# Patient Record
Sex: Female | Born: 1995 | Race: White | Hispanic: No | Marital: Single | Smoking: Current every day smoker
Health system: Southern US, Community
[De-identification: ages and names within clinical notes are randomized; demographics above are authoritative.]

## PROBLEM LIST (undated history)

## (undated) ENCOUNTER — Emergency Department (HOSPITAL_COMMUNITY): Admission: EM | Payer: Medicaid Other | Source: Home / Self Care

## (undated) DIAGNOSIS — N83209 Unspecified ovarian cyst, unspecified side: Secondary | ICD-10-CM

---
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
## 2016-02-27 ENCOUNTER — Emergency Department (HOSPITAL_COMMUNITY)
Admission: EM | Admit: 2016-02-27 | Discharge: 2016-02-28 | Disposition: A | Discharge status: Home / Self Care | Payer: Medicaid Other | Attending: Emergency Medicine | Admitting: Emergency Medicine

## 2016-02-27 ENCOUNTER — Encounter (HOSPITAL_COMMUNITY): Payer: Self-pay | Admitting: Radiology

## 2016-02-27 ENCOUNTER — Emergency Department (HOSPITAL_COMMUNITY): Payer: Medicaid Other

## 2016-02-27 DIAGNOSIS — Y92410 Unspecified street and highway as the place of occurrence of the external cause: Secondary | ICD-10-CM | POA: Diagnosis not present

## 2016-02-27 DIAGNOSIS — Z3202 Encounter for pregnancy test, result negative: Secondary | ICD-10-CM | POA: Insufficient documentation

## 2016-02-27 DIAGNOSIS — Y9389 Activity, other specified: Secondary | ICD-10-CM | POA: Diagnosis not present

## 2016-02-27 DIAGNOSIS — S7001XA Contusion of right hip, initial encounter: Secondary | ICD-10-CM | POA: Insufficient documentation

## 2016-02-27 DIAGNOSIS — S59901A Unspecified injury of right elbow, initial encounter: Secondary | ICD-10-CM | POA: Diagnosis present

## 2016-02-27 DIAGNOSIS — Y998 Other external cause status: Secondary | ICD-10-CM | POA: Insufficient documentation

## 2016-02-27 DIAGNOSIS — S5001XA Contusion of right elbow, initial encounter: Secondary | ICD-10-CM | POA: Insufficient documentation

## 2016-02-27 DIAGNOSIS — S50311A Abrasion of right elbow, initial encounter: Secondary | ICD-10-CM | POA: Insufficient documentation

## 2016-02-27 DIAGNOSIS — S8992XA Unspecified injury of left lower leg, initial encounter: Secondary | ICD-10-CM | POA: Insufficient documentation

## 2016-02-27 DIAGNOSIS — S199XXA Unspecified injury of neck, initial encounter: Secondary | ICD-10-CM | POA: Insufficient documentation

## 2016-02-27 DIAGNOSIS — S3991XA Unspecified injury of abdomen, initial encounter: Secondary | ICD-10-CM | POA: Insufficient documentation

## 2016-02-27 DIAGNOSIS — M25551 Pain in right hip: Secondary | ICD-10-CM

## 2016-02-27 DIAGNOSIS — R52 Pain, unspecified: Secondary | ICD-10-CM

## 2016-02-27 DIAGNOSIS — S70211A Abrasion, right hip, initial encounter: Secondary | ICD-10-CM | POA: Insufficient documentation

## 2016-02-27 DIAGNOSIS — S29001A Unspecified injury of muscle and tendon of front wall of thorax, initial encounter: Secondary | ICD-10-CM | POA: Diagnosis not present

## 2016-02-27 LAB — CBC WITH DIFFERENTIAL/PLATELET
BASOS ABS: 0 10*3/uL (ref 0.0–0.1)
BASOS PCT: 0 %
Eosinophils Absolute: 0.1 10*3/uL (ref 0.0–0.7)
Eosinophils Relative: 1 %
HEMATOCRIT: 45.5 % (ref 36.0–46.0)
HEMOGLOBIN: 14.9 g/dL (ref 12.0–15.0)
Lymphocytes Relative: 31 %
Lymphs Abs: 2.9 10*3/uL (ref 0.7–4.0)
MCH: 28.8 pg (ref 26.0–34.0)
MCHC: 32.7 g/dL (ref 30.0–36.0)
MCV: 87.8 fL (ref 78.0–100.0)
MONOS PCT: 6 %
Monocytes Absolute: 0.6 10*3/uL (ref 0.1–1.0)
NEUTROS ABS: 5.6 10*3/uL (ref 1.7–7.7)
NEUTROS PCT: 62 %
Platelets: 291 10*3/uL (ref 150–400)
RBC: 5.18 MIL/uL — AB (ref 3.87–5.11)
RDW: 13.8 % (ref 11.5–15.5)
WBC: 9.1 10*3/uL (ref 4.0–10.5)

## 2016-02-27 LAB — COMPREHENSIVE METABOLIC PANEL
ALBUMIN: 4.3 g/dL (ref 3.5–5.0)
ALK PHOS: 68 U/L (ref 38–126)
ALT: 14 U/L (ref 14–54)
AST: 20 U/L (ref 15–41)
Anion gap: 15 (ref 5–15)
BILIRUBIN TOTAL: 1.1 mg/dL (ref 0.3–1.2)
BUN: 9 mg/dL (ref 6–20)
CALCIUM: 10.1 mg/dL (ref 8.9–10.3)
CO2: 19 mmol/L — AB (ref 22–32)
Chloride: 110 mmol/L (ref 101–111)
Creatinine, Ser: 0.82 mg/dL (ref 0.44–1.00)
GFR calc Af Amer: >60 mL/min (ref >60)
GFR calc non Af Amer: >60 mL/min (ref >60)
GLUCOSE: 79 mg/dL (ref 65–99)
Potassium: 3.4 mmol/L — ABNORMAL LOW (ref 3.5–5.1)
SODIUM: 144 mmol/L (ref 135–145)
TOTAL PROTEIN: 7.5 g/dL (ref 6.5–8.1)

## 2016-02-27 LAB — SAMPLE TO BLOOD BANK

## 2016-02-27 LAB — URINALYSIS, ROUTINE W REFLEX MICROSCOPIC
Bilirubin Urine: NEGATIVE
GLUCOSE, UA: NEGATIVE mg/dL
KETONES UR: NEGATIVE mg/dL
Leukocytes, UA: NEGATIVE
Nitrite: NEGATIVE
PH: 6.5 (ref 5.0–8.0)
Protein, ur: NEGATIVE mg/dL
Specific Gravity, Urine: 1.007 (ref 1.005–1.030)

## 2016-02-27 LAB — URINE MICROSCOPIC-ADD ON

## 2016-02-27 LAB — I-STAT BETA HCG BLOOD, ED (MC, WL, AP ONLY): I-stat hCG, quantitative: <5 m[IU]/mL (ref <5)

## 2016-02-27 LAB — ETHANOL: Alcohol, Ethyl (B): 74 mg/dL — ABNORMAL HIGH (ref <5)

## 2016-02-27 MED ORDER — SODIUM CHLORIDE 0.9 % IV SOLN: 1000.0000 mL | INTRAVENOUS | Status: DC

## 2016-02-27 MED ORDER — HYDROCODONE-ACETAMINOPHEN 5-325 MG PO TABS
2.0000 | ORAL_TABLET | Freq: Once | ORAL | Status: AC
  Administered 2016-02-27: 2 via ORAL
  Filled 2016-02-27: qty 2

## 2016-02-27 MED ORDER — IOPAMIDOL (ISOVUE-300) INJECTION 61%
INTRAVENOUS | Status: AC
  Administered 2016-02-27: 100 mL
  Filled 2016-02-27: qty 100

## 2016-02-27 MED ORDER — SODIUM CHLORIDE 0.9 % IV SOLN
1000.0000 mL | Freq: Once | INTRAVENOUS | Status: AC
  Administered 2016-02-27: 1000 mL via INTRAVENOUS

## 2016-02-27 MED ORDER — HYDROMORPHONE HCL 1 MG/ML IJ SOLN
1.0000 mg | Freq: Once | INTRAMUSCULAR | Status: AC
  Administered 2016-02-27: 1 mg via INTRAVENOUS
  Filled 2016-02-27: qty 1

## 2016-02-27 MED ORDER — HYDROCODONE-ACETAMINOPHEN 5-325 MG PO TABS: 2.0000 | ORAL_TABLET | ORAL | Status: DC | PRN
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
## 2016-02-27 NOTE — Discharge Instructions (Signed)
Abrasion An abrasion is a cut or scrape on the outer surface of your skin. An abrasion does not extend through all of the layers of your skin. It is important to care for your abrasion properly to prevent infection. CAUSES Most abrasions are caused by falling on or gliding across the ground or another surface. When your skin rubs on something, the outer and inner layer of skin rubs off.  SYMPTOMS A cut or scrape is the main symptom of this condition. The scrape may be bleeding, or it may appear red or pink. If there was an associated fall, there may be an underlying bruise. DIAGNOSIS An abrasion is diagnosed with a physical exam. TREATMENT Treatment for this condition depends on how large and deep the abrasion is. Usually, your abrasion will be cleaned with water and mild soap. This removes any dirt or debris that may be stuck. An antibiotic ointment may be applied to the abrasion to help prevent infection. A bandage (dressing) may be placed on the abrasion to keep it clean. You may also need a tetanus shot. HOME CARE INSTRUCTIONS Medicines  Take or apply medicines only as directed by your health care provider.  If you were prescribed an antibiotic ointment, finish all of it even if you start to feel better. Wound Care  Clean the wound with mild soap and water 2-3 times per day or as directed by your health care provider. Pat your wound dry with a clean towel. Do not rub it.  There are many different ways to close and cover a wound. Follow instructions from your health care provider about:  Wound care.  Dressing changes and removal.  Check your wound every day for signs of infection. Watch for:  Redness, swelling, or pain.  Fluid, blood, or pus. General Instructions  Keep the dressing dry as directed by your health care provider. Do not take baths, swim, use a hot tub, or do anything that would put your wound underwater until your health care provider approves.  If there is  swelling, raise (elevate) the injured area above the level of your heart while you are sitting or lying down.  Keep all follow-up visits as directed by your health care provider. This is important. SEEK MEDICAL CARE IF:  You received a tetanus shot and you have swelling, severe pain, redness, or bleeding at the injection site.  Your pain is not controlled with medicine.  You have increased redness, swelling, or pain at the site of your wound. SEEK IMMEDIATE MEDICAL CARE IF:  You have a red streak going away from your wound.  You have a fever.  You have fluid, blood, or pus coming from your wound.  You notice a bad smell coming from your wound or your dressing.   This information is not intended to replace advice given to you by your health care provider. Make sure you discuss any questions you have with your health care provider.   Document Released: 08/03/2005 Document Revised: 07/15/2015 Document Reviewed: 10/22/2014 Elsevier Interactive Patient Education 2016 [employer]  

## 2016-02-27 NOTE — ED Notes (Signed)
Pt in CT.

## 2016-02-29 ENCOUNTER — Encounter: Payer: Self-pay | Admitting: Emergency Medicine

## 2016-02-29 MED ORDER — IOPAMIDOL (ISOVUE-300) INJECTION 61%
100.0000 mL | Freq: Once | INTRAVENOUS | Status: AC | PRN
  Administered 2016-02-27: 100 mL via INTRAVENOUS

## 2016-10-01 ENCOUNTER — Emergency Department
Admission: EM | Admit: 2016-10-01 | Discharge: 2016-10-01 | Disposition: A | Discharge status: Home / Self Care | Payer: Self-pay | Attending: Emergency Medicine | Admitting: Emergency Medicine

## 2016-10-01 ENCOUNTER — Emergency Department: Payer: Self-pay

## 2016-10-01 ENCOUNTER — Encounter: Payer: Self-pay | Admitting: Emergency Medicine

## 2016-10-01 DIAGNOSIS — S32009A Unspecified fracture of unspecified lumbar vertebra, initial encounter for closed fracture: Secondary | ICD-10-CM

## 2016-10-01 DIAGNOSIS — M545 Low back pain, unspecified: Secondary | ICD-10-CM

## 2016-10-01 DIAGNOSIS — W1839XA Other fall on same level, initial encounter: Secondary | ICD-10-CM | POA: Insufficient documentation

## 2016-10-01 DIAGNOSIS — Y9352 Activity, horseback riding: Secondary | ICD-10-CM | POA: Insufficient documentation

## 2016-10-01 DIAGNOSIS — R102 Pelvic and perineal pain: Secondary | ICD-10-CM | POA: Insufficient documentation

## 2016-10-01 DIAGNOSIS — Y998 Other external cause status: Secondary | ICD-10-CM | POA: Insufficient documentation

## 2016-10-01 DIAGNOSIS — Y929 Unspecified place or not applicable: Secondary | ICD-10-CM | POA: Insufficient documentation

## 2016-10-01 DIAGNOSIS — S32028A Other fracture of second lumbar vertebra, initial encounter for closed fracture: Secondary | ICD-10-CM | POA: Insufficient documentation

## 2016-10-01 DIAGNOSIS — M62838 Other muscle spasm: Secondary | ICD-10-CM

## 2016-10-01 DIAGNOSIS — S32048A Other fracture of fourth lumbar vertebra, initial encounter for closed fracture: Secondary | ICD-10-CM | POA: Insufficient documentation

## 2016-10-01 LAB — COMPREHENSIVE METABOLIC PANEL
ALT: 13 U/L — ABNORMAL LOW (ref 14–54)
ANION GAP: 13 (ref 5–15)
AST: 32 U/L (ref 15–41)
Albumin: 4.5 g/dL (ref 3.5–5.0)
Alkaline Phosphatase: 52 U/L (ref 38–126)
BILIRUBIN TOTAL: 0.9 mg/dL (ref 0.3–1.2)
BUN: 11 mg/dL (ref 6–20)
CO2: 21 mmol/L — ABNORMAL LOW (ref 22–32)
Calcium: 9.7 mg/dL (ref 8.9–10.3)
Chloride: 106 mmol/L (ref 101–111)
Creatinine, Ser: 1 mg/dL (ref 0.44–1.00)
GFR calc Af Amer: >60 mL/min (ref >60)
Glucose, Bld: 88 mg/dL (ref 65–99)
POTASSIUM: 3.2 mmol/L — AB (ref 3.5–5.1)
Sodium: 140 mmol/L (ref 135–145)
TOTAL PROTEIN: 7.4 g/dL (ref 6.5–8.1)

## 2016-10-01 LAB — CBC WITH DIFFERENTIAL/PLATELET
Basophils Absolute: 0.1 10*3/uL (ref 0–0.1)
Basophils Relative: 1 %
Eosinophils Absolute: 0.1 10*3/uL (ref 0–0.7)
Eosinophils Relative: 1 %
HEMATOCRIT: 42.2 % (ref 35.0–47.0)
Hemoglobin: 14.3 g/dL (ref 12.0–16.0)
LYMPHS PCT: 15 %
Lymphs Abs: 1.6 10*3/uL (ref 1.0–3.6)
MCH: 29.7 pg (ref 26.0–34.0)
MCHC: 33.9 g/dL (ref 32.0–36.0)
MCV: 87.5 fL (ref 80.0–100.0)
MONO ABS: 0.7 10*3/uL (ref 0.2–0.9)
MONOS PCT: 6 %
Neutro Abs: 8.6 10*3/uL — ABNORMAL HIGH (ref 1.4–6.5)
Neutrophils Relative %: 77 %
Platelets: 242 10*3/uL (ref 150–440)
RBC: 4.82 MIL/uL (ref 3.80–5.20)
RDW: 14.7 % — AB (ref 11.5–14.5)
WBC: 11.1 10*3/uL — ABNORMAL HIGH (ref 3.6–11.0)

## 2016-10-01 LAB — HCG, QUANTITATIVE, PREGNANCY

## 2016-10-01 LAB — ABO/RH: ABO/RH(D): O NEG

## 2016-10-01 MED ORDER — DIAZEPAM 5 MG PO TABS: 5.0000 mg | ORAL_TABLET | Freq: Three times a day (TID) | ORAL | 0 refills | Status: DC | PRN

## 2016-10-01 MED ORDER — DIAZEPAM 5 MG/ML IJ SOLN
5.0000 mg | Freq: Once | INTRAMUSCULAR | Status: AC
  Administered 2016-10-01: 5 mg via INTRAVENOUS
  Filled 2016-10-01: qty 2

## 2016-10-01 MED ORDER — LORAZEPAM 2 MG/ML IJ SOLN
1.0000 mg | Freq: Once | INTRAMUSCULAR | Status: AC
  Administered 2016-10-01: 1 mg via INTRAVENOUS

## 2016-10-01 MED ORDER — IBUPROFEN 800 MG PO TABS: 800.0000 mg | ORAL_TABLET | Freq: Three times a day (TID) | ORAL | 0 refills | Status: DC | PRN

## 2016-10-01 MED ORDER — HYDROMORPHONE HCL 1 MG/ML IJ SOLN
1.0000 mg | Freq: Once | INTRAMUSCULAR | Status: AC
  Administered 2016-10-01: 1 mg via INTRAVENOUS
  Filled 2016-10-01: qty 1

## 2016-10-01 MED ORDER — SODIUM CHLORIDE 0.9 % IV SOLN
8.0000 mg | Freq: Once | INTRAVENOUS | Status: AC
  Administered 2016-10-01: 8 mg via INTRAVENOUS
  Filled 2016-10-01: qty 4

## 2016-10-01 MED ORDER — IOPAMIDOL (ISOVUE-300) INJECTION 61%
100.0000 mL | Freq: Once | INTRAVENOUS | Status: AC | PRN
  Administered 2016-10-01: 100 mL via INTRAVENOUS

## 2016-10-01 MED ORDER — ONDANSETRON HCL 4 MG/2ML IJ SOLN
INTRAMUSCULAR | Status: AC
  Filled 2016-10-01: qty 4

## 2016-10-01 MED ORDER — DIAZEPAM 5 MG/ML IJ SOLN: 5.0000 mg | Freq: Once | INTRAMUSCULAR | Status: DC

## 2016-10-01 MED ORDER — LORAZEPAM 2 MG/ML IJ SOLN
INTRAMUSCULAR | Status: AC
  Administered 2016-10-01: 1 mg via INTRAVENOUS
  Filled 2016-10-01: qty 1

## 2016-10-01 MED ORDER — OXYCODONE-ACETAMINOPHEN 7.5-325 MG PO TABS: 1.0000 | ORAL_TABLET | ORAL | 0 refills | Status: AC | PRN
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
## 2016-10-01 NOTE — ED Notes (Signed)
ED Provider at bedside. 
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
## 2016-10-01 NOTE — ED Notes (Signed)
Pt squirming, cursing  and screaming in bed unable to lie still. Mayford KnifeWilliams, MD brought to bedside. New orders received

## 2016-10-01 NOTE — ED Notes (Signed)
Pt with friend at bedside. Nurse asked to call family. Pt states I do not talk to my family

## 2016-10-01 NOTE — ED Notes (Signed)
Pt resting at this time. Female friend at bedside. Pt escorted to MRI with ED tech.

## 2018-07-03 ENCOUNTER — Emergency Department: Payer: Medicaid Other

## 2018-07-03 ENCOUNTER — Encounter: Payer: Self-pay | Admitting: Emergency Medicine

## 2018-07-03 ENCOUNTER — Emergency Department
Admission: EM | Admit: 2018-07-03 | Discharge: 2018-07-03 | Disposition: A | Discharge status: Home / Self Care | Payer: Medicaid Other | Attending: Emergency Medicine | Admitting: Emergency Medicine

## 2018-07-03 DIAGNOSIS — R51 Headache: Secondary | ICD-10-CM | POA: Diagnosis not present

## 2018-07-03 DIAGNOSIS — M25519 Pain in unspecified shoulder: Secondary | ICD-10-CM | POA: Insufficient documentation

## 2018-07-03 DIAGNOSIS — N938 Other specified abnormal uterine and vaginal bleeding: Secondary | ICD-10-CM | POA: Diagnosis not present

## 2018-07-03 DIAGNOSIS — Y929 Unspecified place or not applicable: Secondary | ICD-10-CM | POA: Diagnosis not present

## 2018-07-03 DIAGNOSIS — M545 Low back pain: Secondary | ICD-10-CM | POA: Insufficient documentation

## 2018-07-03 DIAGNOSIS — Y9389 Activity, other specified: Secondary | ICD-10-CM | POA: Diagnosis not present

## 2018-07-03 DIAGNOSIS — R0781 Pleurodynia: Secondary | ICD-10-CM | POA: Diagnosis not present

## 2018-07-03 DIAGNOSIS — M542 Cervicalgia: Secondary | ICD-10-CM | POA: Diagnosis not present

## 2018-07-03 DIAGNOSIS — M25569 Pain in unspecified knee: Secondary | ICD-10-CM | POA: Diagnosis not present

## 2018-07-03 DIAGNOSIS — Y999 Unspecified external cause status: Secondary | ICD-10-CM | POA: Diagnosis not present

## 2018-07-03 LAB — CBC
HCT: 42.9 % (ref 35.0–47.0)
Hemoglobin: 14.8 g/dL (ref 12.0–16.0)
MCH: 29.8 pg (ref 26.0–34.0)
MCHC: 34.4 g/dL (ref 32.0–36.0)
MCV: 86.6 fL (ref 80.0–100.0)
PLATELETS: 337 10*3/uL (ref 150–440)
RBC: 4.96 MIL/uL (ref 3.80–5.20)
RDW: 15.7 % — AB (ref 11.5–14.5)
WBC: 9.7 10*3/uL (ref 3.6–11.0)

## 2018-07-03 LAB — COMPREHENSIVE METABOLIC PANEL
ALBUMIN: 4.7 g/dL (ref 3.5–5.0)
ALT: 13 U/L (ref 0–44)
ANION GAP: 8 (ref 5–15)
AST: 20 U/L (ref 15–41)
Alkaline Phosphatase: 69 U/L (ref 38–126)
BUN: 12 mg/dL (ref 6–20)
CHLORIDE: 102 mmol/L (ref 98–111)
CO2: 29 mmol/L (ref 22–32)
Calcium: 9.6 mg/dL (ref 8.9–10.3)
Creatinine, Ser: 0.85 mg/dL (ref 0.44–1.00)
GFR calc Af Amer: >60 mL/min (ref >60)
Glucose, Bld: 107 mg/dL — ABNORMAL HIGH (ref 70–99)
Potassium: 4.5 mmol/L (ref 3.5–5.1)
Sodium: 139 mmol/L (ref 135–145)
Total Bilirubin: 1 mg/dL (ref 0.3–1.2)
Total Protein: 8.1 g/dL (ref 6.5–8.1)

## 2018-07-03 LAB — POCT PREGNANCY, URINE: PREG TEST UR: NEGATIVE

## 2018-07-03 MED ORDER — CYCLOBENZAPRINE HCL 10 MG PO TABS: 10.0000 mg | ORAL_TABLET | Freq: Three times a day (TID) | ORAL | 0 refills | Status: DC | PRN

## 2018-07-03 MED ORDER — IBUPROFEN 800 MG PO TABS: 800.0000 mg | ORAL_TABLET | Freq: Three times a day (TID) | ORAL | 0 refills | Status: DC | PRN
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
## 2018-07-03 NOTE — ED Notes (Signed)
Pt placed in rm, Sonjia,RN aware.
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
## 2018-07-03 NOTE — ED Notes (Signed)
Pt tearful at time of discharge and verbalized frustration that the doctor did not "check out" her hips or her knees. RN attempted to explain to pt but was unable to provide an explanation to calm pt. Pt left ED in NAD and able to ambulate independently without difficulty. Pt remained tearful at time of discharge.

## 2018-11-12 ENCOUNTER — Encounter (HOSPITAL_COMMUNITY): Payer: Self-pay | Admitting: Emergency Medicine

## 2018-11-12 ENCOUNTER — Emergency Department (HOSPITAL_COMMUNITY)
Admission: EM | Admit: 2018-11-12 | Discharge: 2018-11-12 | Disposition: A | Discharge status: Home / Self Care | Payer: Medicaid Other | Attending: Emergency Medicine | Admitting: Emergency Medicine

## 2018-11-12 DIAGNOSIS — H9202 Otalgia, left ear: Secondary | ICD-10-CM | POA: Insufficient documentation

## 2018-11-12 DIAGNOSIS — H6691 Otitis media, unspecified, right ear: Secondary | ICD-10-CM | POA: Insufficient documentation

## 2018-11-12 DIAGNOSIS — R0981 Nasal congestion: Secondary | ICD-10-CM | POA: Insufficient documentation

## 2018-11-12 MED ORDER — AMOXICILLIN 500 MG PO CAPS: 500.0000 mg | ORAL_CAPSULE | Freq: Three times a day (TID) | ORAL | 0 refills | Status: DC

## 2018-11-12 NOTE — ED Triage Notes (Signed)
Pt c/o bilat ear pain and pressure for couple days. Reports had congestion for about week.

## 2018-11-12 NOTE — Discharge Instructions (Addendum)
Take the medication as directed. Call the [facility] for follow up. Tell them you were seen in the ED and need a follow up.

## 2018-11-12 NOTE — ED Provider Notes (Signed)
[facility]-EMERGENCY DEPT Provider Note   CSN: [id number] Arrival date & time: 11/12/18  1811     History   Chief Complaint Chief Complaint  Patient presents with  . Otalgia    HPI Meagan Gonzalez is a 23 y.o. female who presents to the ED with bilateral ear pain that started 2 days ago. Patient reports having congestion for the past week. Patient reports having frequent ear infections as a child but none recently. Patient reports hx of breaking her back x 2 and has chronic pain. Reports that the pain is worse some days and sometimes has what she thinks may be panic attacks and request referral to a PCP.  HPI  History reviewed. No pertinent past medical history.  There are no active problems to display for this patient.   History reviewed. No pertinent surgical history.   OB History    Gravida  1   Para  0   Term  0   Preterm  0   AB  1   Living        SAB  0   TAB  0   Ectopic  0   Multiple      Live Births               Home Medications    Prior to Admission medications   Medication Sig Start Date End Date Taking? Authorizing Provider  amoxicillin (AMOXIL) 500 MG capsule Take 1 capsule (500 mg total) by mouth 3 (three) times daily. 11/12/18   Janne NapoleonNeese, Hope M, NP    Family History No family history on file.  Social History Social History   Tobacco Use  . Smoking status: Not on file  . Smokeless tobacco: Never Used  Substance Use Topics  . Alcohol use: Yes  . Drug use: Yes    Types: Marijuana     Allergies   Patient has no known allergies.   Review of Systems Review of Systems  Constitutional: Negative for fever.  HENT: Positive for congestion and ear pain.   Respiratory: Negative for shortness of breath.   Musculoskeletal: Positive for arthralgias.  Skin: Negative for rash.  Neurological: Negative for weakness.     Physical Exam Updated Vital Signs BP 125/89 (BP Location: Left Arm)   Pulse (!) 108    Temp 98 F (36.7 C) (Oral)   Resp 17   LMP 10/24/2018   SpO2 99%   Physical Exam Vitals signs and nursing note reviewed.  Constitutional:      Appearance: She is well-developed.  HENT:     Head: Normocephalic.     Jaw: No trismus or pain on movement.     Right Ear: No mastoid tenderness.     Left Ear: Tympanic membrane normal. No mastoid tenderness.     Ears:     Comments: Right TM partially occluded with wax, there appears to be erythema of the part of the drum visualized. Tried to remove wax but patient reports is to painful.     Nose: Congestion present.     Mouth/Throat:     Pharynx: Posterior oropharyngeal erythema present. No oropharyngeal exudate.  Eyes:     Extraocular Movements: Extraocular movements intact.     Conjunctiva/sclera: Conjunctivae normal.  Neck:     Musculoskeletal: Neck supple. Normal range of motion. No neck rigidity or pain with movement.  Cardiovascular:     Rate and Rhythm: Tachycardia present.  Pulmonary:     Effort: Pulmonary effort is  normal.  Musculoskeletal: Normal range of motion.  Lymphadenopathy:     Cervical: No cervical adenopathy.  Skin:    General: Skin is warm and dry.  Neurological:     Mental Status: She is alert and oriented to person, place, and time.  Psychiatric:        Mood and Affect: Mood normal.      ED Treatments / Results  Labs (all labs ordered are listed, but only abnormal results are displayed) Labs Reviewed - No data to display Radiology No results found.  Procedures Procedures (including critical care time)  Medications Ordered in ED Medications - No data to display   Initial Impression / Assessment and Plan / ED Course  I have reviewed the triage vital signs and the nursing notes.  Patient presents with otalgia and exam consistent with acute otitis media. No concern for acute mastoiditis, meningitis. No antibiotic use in the last month.  Patient discharged home with Amoxicillin.  Advised patient to  call [facility] for follow-up.  I have also discussed reasons to return immediately to the ER.  Patient expresses understanding and agrees with plan.   Final Clinical Impressions(s) / ED Diagnoses   Final diagnoses:  Right otitis media, unspecified otitis media type    ED Discharge Orders         Ordered    amoxicillin (AMOXIL) 500 MG capsule  3 times daily     11/12/18 1913           Kerrie Buffaloeese, Hope [employer], [place] 11/12/18 1916    Clarene DukeLittle, Ambrose Finlandachel Morgan, MD 11/13/18 0030

## 2020-09-18 ENCOUNTER — Emergency Department: Payer: Self-pay

## 2020-09-18 ENCOUNTER — Other Ambulatory Visit: Payer: Self-pay

## 2020-09-18 ENCOUNTER — Inpatient Hospital Stay
Admission: EM | Admit: 2020-09-18 | Discharge: 2020-09-19 | DRG: 377 | Discharge status: Against Medical Advice | Payer: Self-pay | Attending: Pulmonary Disease | Admitting: Pulmonary Disease

## 2020-09-18 DIAGNOSIS — R1084 Generalized abdominal pain: Secondary | ICD-10-CM | POA: Diagnosis present

## 2020-09-18 DIAGNOSIS — Z5329 Procedure and treatment not carried out because of patient's decision for other reasons: Secondary | ICD-10-CM | POA: Diagnosis not present

## 2020-09-18 DIAGNOSIS — E876 Hypokalemia: Secondary | ICD-10-CM | POA: Diagnosis present

## 2020-09-18 DIAGNOSIS — Z8379 Family history of other diseases of the digestive system: Secondary | ICD-10-CM

## 2020-09-18 DIAGNOSIS — R578 Other shock: Secondary | ICD-10-CM | POA: Diagnosis present

## 2020-09-18 DIAGNOSIS — K921 Melena: Principal | ICD-10-CM | POA: Diagnosis present

## 2020-09-18 DIAGNOSIS — K922 Gastrointestinal hemorrhage, unspecified: Secondary | ICD-10-CM

## 2020-09-18 DIAGNOSIS — Z8619 Personal history of other infectious and parasitic diseases: Secondary | ICD-10-CM

## 2020-09-18 DIAGNOSIS — Z20822 Contact with and (suspected) exposure to covid-19: Secondary | ICD-10-CM | POA: Diagnosis present

## 2020-09-18 DIAGNOSIS — D62 Acute posthemorrhagic anemia: Secondary | ICD-10-CM | POA: Diagnosis present

## 2020-09-18 DIAGNOSIS — E872 Acidosis: Secondary | ICD-10-CM | POA: Diagnosis present

## 2020-09-18 LAB — COMPREHENSIVE METABOLIC PANEL
ALT: 10 U/L (ref 0–44)
AST: 25 U/L (ref 15–41)
Albumin: 3.8 g/dL (ref 3.5–5.0)
Alkaline Phosphatase: 42 U/L (ref 38–126)
Anion gap: 15 (ref 5–15)
BUN: 13 mg/dL (ref 6–20)
CO2: 17 mmol/L — ABNORMAL LOW (ref 22–32)
Calcium: 8.2 mg/dL — ABNORMAL LOW (ref 8.9–10.3)
Chloride: 105 mmol/L (ref 98–111)
Creatinine, Ser: 0.81 mg/dL (ref 0.44–1.00)
GFR, Estimated: >60 mL/min (ref >60)
Glucose, Bld: 111 mg/dL — ABNORMAL HIGH (ref 70–99)
Potassium: 3.4 mmol/L — ABNORMAL LOW (ref 3.5–5.1)
Sodium: 137 mmol/L (ref 135–145)
Total Bilirubin: 0.8 mg/dL (ref 0.3–1.2)
Total Protein: 6.5 g/dL (ref 6.5–8.1)

## 2020-09-18 LAB — CBC WITH DIFFERENTIAL/PLATELET
Abs Immature Granulocytes: 0.04 10*3/uL (ref 0.00–0.07)
Basophils Absolute: 0 10*3/uL (ref 0.0–0.1)
Basophils Relative: 0 %
Eosinophils Absolute: 0.1 10*3/uL (ref 0.0–0.5)
Eosinophils Relative: 1 %
HCT: 17.4 % — ABNORMAL LOW (ref 36.0–46.0)
Hemoglobin: 4.4 g/dL — CL (ref 12.0–15.0)
Immature Granulocytes: 0 %
Lymphocytes Relative: 11 %
Lymphs Abs: 1 10*3/uL (ref 0.7–4.0)
MCH: 17.7 pg — ABNORMAL LOW (ref 26.0–34.0)
MCHC: 25.3 g/dL — ABNORMAL LOW (ref 30.0–36.0)
MCV: 70.2 fL — ABNORMAL LOW (ref 80.0–100.0)
Monocytes Absolute: 0.6 10*3/uL (ref 0.1–1.0)
Monocytes Relative: 7 %
Neutro Abs: 7.2 10*3/uL (ref 1.7–7.7)
Neutrophils Relative %: 81 %
Platelets: 369 10*3/uL (ref 150–400)
RBC: 2.48 MIL/uL — ABNORMAL LOW (ref 3.87–5.11)
RDW: 18.3 % — ABNORMAL HIGH (ref 11.5–15.5)
WBC: 9 10*3/uL (ref 4.0–10.5)
nRBC: 0 % (ref 0.0–0.2)

## 2020-09-18 LAB — PROTIME-INR
INR: 1.3 — ABNORMAL HIGH (ref 0.8–1.2)
Prothrombin Time: 15.8 seconds — ABNORMAL HIGH (ref 11.4–15.2)

## 2020-09-18 LAB — HEMOGLOBIN AND HEMATOCRIT, BLOOD
HCT: 15.6 % — ABNORMAL LOW (ref 36.0–46.0)
Hemoglobin: 4.1 g/dL — CL (ref 12.0–15.0)

## 2020-09-18 LAB — FIBRINOGEN: Fibrinogen: 113 mg/dL — ABNORMAL LOW (ref 210–475)

## 2020-09-18 MED ORDER — PANTOPRAZOLE SODIUM 40 MG IV SOLR
40.0000 mg | Freq: Once | INTRAVENOUS | Status: AC
  Administered 2020-09-18: 40 mg via INTRAVENOUS
  Filled 2020-09-18: qty 40

## 2020-09-18 MED ORDER — POTASSIUM CHLORIDE CRYS ER 20 MEQ PO TBCR
40.0000 meq | EXTENDED_RELEASE_TABLET | Freq: Once | ORAL | Status: AC
  Administered 2020-09-18: 40 meq via ORAL
  Filled 2020-09-18: qty 2

## 2020-09-18 MED ORDER — ONDANSETRON HCL 4 MG/2ML IJ SOLN
4.0000 mg | Freq: Once | INTRAMUSCULAR | Status: AC
  Administered 2020-09-19: 4 mg via INTRAVENOUS
  Filled 2020-09-18: qty 2

## 2020-09-18 MED ORDER — IOHEXOL 350 MG/ML SOLN
100.0000 mL | Freq: Once | INTRAVENOUS | Status: AC | PRN
  Administered 2020-09-18: 100 mL via INTRAVENOUS

## 2020-09-18 NOTE — ED Notes (Addendum)
Date and time results received: 09/18/20   Test: hemoglobin Critical Value: 4.1  Name of Provider Notified: smith md  Increase rate of blood is 250 cc/hr per md.

## 2020-09-18 NOTE — ED Notes (Addendum)
Blood administered at 2145 by this rn and verified by michele rn through 18G rac iv. Blood initiated at 120cc/ hour. Per md smith after first 15 minutes if no complication tranfused blood at 125cc/hr.

## 2020-09-18 NOTE — ED Notes (Signed)
Pt back from CT. Pt transported with this rn.
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
## 2020-09-18 NOTE — ED Notes (Addendum)
Vs after first 15 minutes of transfusion- Temp 97.9 axillary Hr 100 bp 100/67 rr 22 Rate increased to 125cc/hr

## 2020-09-18 NOTE — ED Triage Notes (Signed)
Pt here via [facility] from home.pt has extensive gI bleeding history, per pt she bleeds everyday. For the last three days she has had worsening and increasing bleeding.   eMs vs- hr 170, bp 73/49, pt opn 15L nrb, pt received 600cc normal saline with ems.

## 2020-09-18 NOTE — ED Notes (Addendum)
Second unit of packed rbcs initiated at this time verified by this RN and Mac RN.  Vital signs- temp oral 97.9 bp 109/47 Hr 83 o2 100 4l [place] Per MD smith rate 999cc/hr

## 2020-09-18 NOTE — ED Notes (Addendum)
First unit of FFP started at this time verified by this RN and Mac RN Vital signs prior to admission are- Temp oral: 97.9 Bp; 109/47 Hr 75 o2 100% 4l [place] Rate initiated at 120cc. Per Md smith tranfuse at 250cc after first 15 minutes.  First unit of blood completed at this time, see vital signs above for post tranfusion vitals.

## 2020-09-19 ENCOUNTER — Inpatient Hospital Stay: Payer: Self-pay

## 2020-09-19 ENCOUNTER — Encounter: Payer: Self-pay | Admitting: Pulmonary Disease

## 2020-09-19 DIAGNOSIS — K922 Gastrointestinal hemorrhage, unspecified: Secondary | ICD-10-CM | POA: Diagnosis present

## 2020-09-19 DIAGNOSIS — R578 Other shock: Secondary | ICD-10-CM

## 2020-09-19 DIAGNOSIS — K625 Hemorrhage of anus and rectum: Secondary | ICD-10-CM

## 2020-09-19 LAB — PROTIME-INR
INR: 1.2 (ref 0.8–1.2)
Prothrombin Time: 15.1 seconds (ref 11.4–15.2)

## 2020-09-19 LAB — URINALYSIS, COMPLETE (UACMP) WITH MICROSCOPIC
Bilirubin Urine: NEGATIVE
Glucose, UA: NEGATIVE mg/dL
Hgb urine dipstick: NEGATIVE
Ketones, ur: 5 mg/dL — AB
Leukocytes,Ua: NEGATIVE
Nitrite: NEGATIVE
Protein, ur: NEGATIVE mg/dL
Specific Gravity, Urine: >1.046 — ABNORMAL HIGH (ref 1.005–1.030)
pH: 7 (ref 5.0–8.0)

## 2020-09-19 LAB — CBC
HCT: 28.9 % — ABNORMAL LOW (ref 36.0–46.0)
Hemoglobin: 8.8 g/dL — ABNORMAL LOW (ref 12.0–15.0)
MCH: 25 pg — ABNORMAL LOW (ref 26.0–34.0)
MCHC: 30.4 g/dL (ref 30.0–36.0)
MCV: 82.1 fL (ref 80.0–100.0)
Platelets: 202 10*3/uL (ref 150–400)
RBC: 3.52 MIL/uL — ABNORMAL LOW (ref 3.87–5.11)
RDW: 21.8 % — ABNORMAL HIGH (ref 11.5–15.5)
WBC: 8.8 10*3/uL (ref 4.0–10.5)
nRBC: 0 % (ref 0.0–0.2)

## 2020-09-19 LAB — URINE DRUG SCREEN, QUALITATIVE (ARMC ONLY)
Amphetamines, Ur Screen: NOT DETECTED
Barbiturates, Ur Screen: NOT DETECTED
Benzodiazepine, Ur Scrn: NOT DETECTED
Cannabinoid 50 Ng, Ur ~~LOC~~: POSITIVE — AB
Cocaine Metabolite,Ur ~~LOC~~: NOT DETECTED
MDMA (Ecstasy)Ur Screen: NOT DETECTED
Methadone Scn, Ur: NOT DETECTED
Opiate, Ur Screen: NOT DETECTED
Phencyclidine (PCP) Ur S: NOT DETECTED
Tricyclic, Ur Screen: NOT DETECTED

## 2020-09-19 LAB — PREGNANCY, URINE: Preg Test, Ur: NEGATIVE

## 2020-09-19 LAB — FIBRIN DERIVATIVES D-DIMER (ARMC ONLY): Fibrin derivatives D-dimer (ARMC): 1054.54 ng/mL (FEU) — ABNORMAL HIGH (ref 0.00–499.00)

## 2020-09-19 LAB — BASIC METABOLIC PANEL
Anion gap: 10 (ref 5–15)
BUN: 10 mg/dL (ref 6–20)
CO2: 23 mmol/L (ref 22–32)
Calcium: 8 mg/dL — ABNORMAL LOW (ref 8.9–10.3)
Chloride: 105 mmol/L (ref 98–111)
Creatinine, Ser: 0.86 mg/dL (ref 0.44–1.00)
GFR, Estimated: >60 mL/min (ref >60)
Glucose, Bld: 90 mg/dL (ref 70–99)
Potassium: 3.5 mmol/L (ref 3.5–5.1)
Sodium: 138 mmol/L (ref 135–145)

## 2020-09-19 LAB — HEMOGLOBIN AND HEMATOCRIT, BLOOD
HCT: 18.5 % — ABNORMAL LOW (ref 36.0–46.0)
HCT: 20.4 % — ABNORMAL LOW (ref 36.0–46.0)
HCT: 25.5 % — ABNORMAL LOW (ref 36.0–46.0)
HCT: 25.7 % — ABNORMAL LOW (ref 36.0–46.0)
Hemoglobin: 4.8 g/dL — CL (ref 12.0–15.0)
Hemoglobin: 6 g/dL — ABNORMAL LOW (ref 12.0–15.0)
Hemoglobin: 8 g/dL — ABNORMAL LOW (ref 12.0–15.0)
Hemoglobin: 8 g/dL — ABNORMAL LOW (ref 12.0–15.0)

## 2020-09-19 LAB — LACTIC ACID, PLASMA
Lactic Acid, Venous: 1.3 mmol/L (ref 0.5–1.9)
Lactic Acid, Venous: 1.1 mmol/L (ref 0.5–1.9)

## 2020-09-19 LAB — RESPIRATORY PANEL BY RT PCR (FLU A&B, COVID)
Influenza A by PCR: NEGATIVE
Influenza B by PCR: NEGATIVE
SARS Coronavirus 2 by RT PCR: NEGATIVE

## 2020-09-19 LAB — FIBRINOGEN: Fibrinogen: 129 mg/dL — ABNORMAL LOW (ref 210–475)

## 2020-09-19 LAB — TYPE AND SCREEN
ABO/RH(D): O NEG
Antibody Screen: NEGATIVE
Unit division: 0

## 2020-09-19 LAB — HIV ANTIBODY (ROUTINE TESTING W REFLEX): HIV Screen 4th Generation wRfx: NONREACTIVE

## 2020-09-19 LAB — HCG, QUANTITATIVE, PREGNANCY: hCG, Beta Chain, Quant, S: 1 m[IU]/mL (ref <5)

## 2020-09-19 LAB — MRSA PCR SCREENING: MRSA by PCR: POSITIVE — AB

## 2020-09-19 MED ORDER — SODIUM PERTECHNETATE TC 99M INJECTION: 10.0000 | Freq: Once | INTRAVENOUS | Status: DC | PRN

## 2020-09-19 MED ORDER — CHLORHEXIDINE GLUCONATE CLOTH 2 % EX PADS: 6.0000 | MEDICATED_PAD | Freq: Every day | CUTANEOUS | Status: DC

## 2020-09-19 MED ORDER — PANTOPRAZOLE SODIUM 40 MG IV SOLR
40.0000 mg | Freq: Two times a day (BID) | INTRAVENOUS | Status: DC
  Administered 2020-09-19 (×2): 40 mg via INTRAVENOUS
  Filled 2020-09-19 (×2): qty 40

## 2020-09-19 MED ORDER — DOCUSATE SODIUM 100 MG PO CAPS: 100.0000 mg | ORAL_CAPSULE | Freq: Two times a day (BID) | ORAL | Status: DC | PRN

## 2020-09-19 MED ORDER — CHLORHEXIDINE GLUCONATE CLOTH 2 % EX PADS
6.0000 | MEDICATED_PAD | Freq: Every day | CUTANEOUS | Status: DC
  Administered 2020-09-19: 6 via TOPICAL

## 2020-09-19 MED ORDER — POLYETHYLENE GLYCOL 3350 17 G PO PACK: 17.0000 g | PACK | Freq: Every day | ORAL | Status: DC | PRN

## 2020-09-19 MED ORDER — LACTATED RINGERS IV SOLN: INTRAVENOUS | Status: DC

## 2020-09-19 MED ORDER — ONDANSETRON HCL 4 MG/2ML IJ SOLN: 4.0000 mg | Freq: Four times a day (QID) | INTRAMUSCULAR | Status: DC | PRN

## 2020-09-19 MED ORDER — MUPIROCIN 2 % EX OINT
1.0000 "application " | TOPICAL_OINTMENT | Freq: Two times a day (BID) | CUTANEOUS | Status: DC
  Filled 2020-09-19: qty 22

## 2020-09-19 NOTE — ED Notes (Addendum)
Third unit of packed red cells started at this time. Verified by this rn and mac rn Temp 98.7 bp- 96/68 Hr 122 Oxygen 100% RA

## 2020-09-19 NOTE — Progress Notes (Signed)
Pt stated she wanted to leave AMA because she was not getting any care here and we've done "nothing" for her. Pt stated "maybe if I had insurance y'all would treat me". I tried to explain to pt that she had a scan done today to look for a source of bleeding. Pt was very concerned about getting a colonoscopy tonight. I informed her that more than likely it would not happen tonight since it was late in the day and she needed to drink the prep in order to be scoped. Dr. Karna Christmas and Dr. Servando Snare aware. Pt stated she was leaving. Pt signed AMA paperwork and PIV's removed. Pt ambulated off unit without distress or incident.

## 2020-09-19 NOTE — ED Notes (Signed)
Pt assisted to bathroom. Medium amount of bright red bleeding in toilet.

## 2020-09-19 NOTE — Consult Note (Signed)
Meagan Minium, MD [facility]  [address] [place], [place] [address] Phone: [phone] Fax : [phone]  Consultation  Referring Provider:     Dr, Katrinka Blazing   Primary Care Physician:  Patient, No Pcp Per Primary Gastroenterologist:  [facility]         Reason for Consultation:     GI bleed  Date of Admission:  09/18/2020 Date of Consultation:  09/19/2020         HPI:   Meagan Gonzalez is a 24 y.o. female Who comes in today with a story of having intermittent bleeding since she was a child.  The patient states that her mother suffers from ulcers and her brother had pyloric stenosis and diverticulitis and had part of his intestines removed.  She reports that she was seen by a gastrologist at [facility] and has attempted a prep for colonoscopy whereupon she states she took magnesium citrate which blocked up and then she passed stools with a lot of blood and made the symptoms worse.  She states that her doctor told her that the magnesium citrate burned her insides.  She states that she never followed up.  The patient now is admitted with significant anemia after reporting significant lower GI bleeding. The patient denies any out call abuse or NSAID use.  She also reports that she's had bright red blood per rectum for months.  She says that it is enough blood to fill up the toilet.  She denies any imaging or other investigation for her GI bleeding.  There is no reported abdominal pain with these episodes. The patient reports that her rectal bleeding dates back to when she was 24 years old.  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  The patient had imaging that showed her to possibly have polycystic ovary syndrome but denies any vaginal bleeding there is no report of any diarrhea and she states that the blood typically is separate from the stools.  The patient was admitted to the ICU due to hemorrhagic shock and a blood count of4.1.  The patient was transfused up to 8.8  at 4:00 in the morning but 11:00 this morning it went down to 8.0.  History reviewed. No pertinent past medical history.  History reviewed. No pertinent surgical history.  Prior to Admission medications   Medication Sig Start Date End Date Taking? Authorizing Provider  Probiotic Product (FORTIFY PROBIOTIC WOMENS EX ST PO) Take 1 capsule by mouth daily.   Yes [provider]    No family history on file.   Social History   Tobacco Use  . Smoking status: Never Smoker  . Smokeless tobacco: Never Used  Vaping Use  . Vaping Use: Some days  Substance Use Topics  . Alcohol use: Yes  . Drug use: Yes    Types: Marijuana    Allergies as of 09/18/2020  . (No Known Allergies)    Review of Systems:    All systems reviewed and negative except where noted in HPI.   Physical Exam:  Vital signs in last 24 hours: Temp:  [98.1 F (36.7 C)-98.6 F (37 C)] 98.1 F (36.7 C) (11/13 0753) Pulse Rate:  [50-154] 52 (11/13 1100) Resp:  [13-25] 13 (11/13 1100) BP: (91-151)/(36-99) 102/60 (11/13 1100) SpO2:  [100 %] 100 % (11/13 1100) Weight:  [60.6 kg-61.7 kg] 60.6 kg (11/13 0753)   General:   Pleasant, cooperative in NAD Head:  Normocephalic and atraumatic. Eyes:  No icterus.   Conjunctiva pink. PERRLA. Ears:  Normal auditory acuity. Neck:  Supple; no masses or thyroidomegaly Lungs: Respirations even and unlabored. Lungs clear to auscultation bilaterally.   No wheezes, crackles, or rhonchi.  Heart:  Regular rate and rhythm;  Without murmur, clicks, rubs or gallops Abdomen:  Soft, nondistended, nontender. Normal bowel sounds. No appreciable masses or hepatomegaly.  No rebound or guarding.  Rectal:  Not performed. Msk:  Symmetrical without gross deformities.    Extremities:  Without edema, cyanosis or clubbing. Neurologic:  Alert and oriented x3;  grossly normal neurologically. Skin:  Intact without significant lesions or rashes. Cervical Nodes:  No significant cervical  adenopathy. Psych:  Alert and cooperative. Normal affect.  LAB RESULTS: Recent Labs    09/18/20 2124 09/18/20 2229 09/18/20 2340 09/19/20 0353 09/19/20 1059  WBC 9.0  --   --  8.8  --   HGB 4.4*   < > 6.0* 8.8* 8.0*  HCT 17.4*   < > 20.4* 28.9* 25.5*  PLT 369  --   --  202  --    < > = values in this interval not displayed.   BMET Recent Labs    09/18/20 2124 09/19/20 0353  NA 137 138  K 3.4* 3.5  CL 105 105  CO2 17* 23  GLUCOSE 111* 90  BUN 13 10  CREATININE 0.81 0.86  CALCIUM 8.2* 8.0*   LFT Recent Labs    09/18/20 2124  PROT 6.5  ALBUMIN 3.8  AST 25  ALT 10  ALKPHOS 42  BILITOT 0.8   PT/INR Recent Labs    09/18/20 2130 09/19/20 0353  LABPROT 15.8* 15.1  INR 1.3* 1.2    STUDIES: DG Chest 1 View  Result Date: 09/18/2020 CLINICAL DATA:  Shortness of breath EXAM: CHEST  1 VIEW COMPARISON:  09/25/2019 FINDINGS: The heart size and mediastinal contours are within normal limits. Both lungs are clear. The visualized skeletal structures are unremarkable. IMPRESSION: No active disease. Electronically Signed   By: Jasmine Pang M.D.   On: 09/18/2020 22:07   CT Angio Abd/Pel W and/or Wo Contrast  Result Date: 09/18/2020 CLINICAL DATA:  GI bleeding. EXAM: CTA ABDOMEN AND PELVIS WITHOUT AND WITH CONTRAST TECHNIQUE: Multidetector CT imaging of the abdomen and pelvis was performed using the standard protocol during bolus administration of intravenous contrast. Multiplanar reconstructed images and MIPs were obtained and reviewed to evaluate the vascular anatomy. CONTRAST:  OMNIPAQUE IOHEXOL 350 MG/ML SOLN COMPARISON:  CT dated October 01, 2016 FINDINGS: VASCULAR Aorta: Normal caliber aorta without aneurysm, dissection, vasculitis or significant stenosis. Celiac: Patent without evidence of aneurysm, dissection, vasculitis or significant stenosis. SMA: Patent without evidence of aneurysm, dissection, vasculitis or significant stenosis. Renals: Both renal arteries are  patent without evidence of aneurysm, dissection, vasculitis, fibromuscular dysplasia or significant stenosis. IMA: Patent without evidence of aneurysm, dissection, vasculitis or significant stenosis. Inflow: Patent without evidence of aneurysm, dissection, vasculitis or significant stenosis. Proximal Outflow: Bilateral common femoral and visualized portions of the superficial and profunda femoral arteries are patent without evidence of aneurysm, dissection, vasculitis or significant stenosis. Veins: No obvious venous abnormality within the limitations of this arterial phase study. Review of the MIP images confirms the above findings. NON-VASCULAR Lower chest: The lung bases are clear. The heart size is normal. The intracardiac blood pool is hypodense relative to the adjacent myocardium consistent with anemia. Hepatobiliary: The liver is normal. Normal gallbladder.There is no biliary ductal dilation. Pancreas: Normal contours without ductal dilatation. No  peripancreatic fluid collection. Spleen: The spleen is borderline enlarged measuring 12.4 cm craniocaudad. Adrenals/Urinary Tract: --Adrenal glands: Unremarkable. --Right kidney/ureter: No hydronephrosis or radiopaque kidney stones. --Left kidney/ureter: No hydronephrosis or radiopaque kidney stones. --Urinary bladder: Unremarkable. Stomach/Bowel: --Stomach/Duodenum: No hiatal hernia or other gastric abnormality. Normal duodenal course and caliber. --Small bowel: Unremarkable. --Colon: Unremarkable. --Appendix: Normal. Vascular/Lymphatic: Normal course and caliber of the major abdominal vessels. --No retroperitoneal lymphadenopathy. --No mesenteric lymphadenopathy. --No pelvic or inguinal lymphadenopathy. Reproductive: There are symmetrically enlarged ovaries bilaterally with suggestion of multiple follicles. Other: No ascites or free air. The abdominal wall is normal. Musculoskeletal. No acute displaced fractures. IMPRESSION: 1. No findings to explain the patient's  GI bleeding. 2. Bilateral symmetrically enlarged ovaries with suggestion of multiple follicles. This may indicate underlying PCOS. 3. Borderline splenomegaly. 4. Anemia. Electronically Signed   By: Katherine Mantle M.D.   On: 09/18/2020 22:37      Impression / Plan:   Assessment: Active Problems:   GI bleed   Moe Edinger is a 24 y.o. y/o female with A history of recurrent GI bleeding.  The patient has not had any extensive workup for this in the past.  She reports the blood to be bright red and now was admitted with hemorrhagic shock and a hemoglobin of 4.1.  The patient's hemoglobin did increase to 8.8 after transfusions but has come down to 8.0.  The patient continuously questions about going home and having a workup as an outpatient.  The patient is hesitant to stay.  I have explained to her that she is in the intensive care unit because she had become quite ill.   Plan:  The patient needs a GI workup for the reason of her bleeding.  The patient should have a Meckel diverticular scan and if negative then should have a colonoscopy.  The patient is very hesitant to staying and having these done while an inpatient and continuously asks whether it could be set up as an outpatient.  I did inform the ICU staff including the nursing staff and the attending about her wishes to leave despite my recommendations that she stay and have it done while she is in the hospital. The patient has continued to have bright red blood per rectum since being in the ICU. I will continue to follow the patient with the ICU staff if she decides to stay for the workup.  Patient and her sister have been explained the plan and agree with it.  Thank you for involving me in the care of this patient.      LOS: 0 days   Meagan Minium, MD, [facility] 09/19/2020, 1:27 PM,  Pager [phone] 7am-5pm  Check AMION for 5pm -7am coverage and on weekends   Note: This dictation was prepared with Dragon dictation along with  smaller phrase technology. Any transcriptional errors that result from this process are unintentional.

## 2020-09-19 NOTE — ED Notes (Signed)
Second unit of blood finished at this time.

## 2020-09-19 NOTE — Progress Notes (Signed)
CRITICAL CARE PROGRESS NOTE   -I met with patient and discussed the serious nature of her bleeding and had recommended for her to stay in the hospital.  I also had explained that the GI specialist had recommended for her to stay in the hospital until further testing and therapy was provided.  Patient insisted on leaving [place] and has left despite my recommendations to remain hospitalized.  Patient does not have a psychiatric history was not confused, and is fully oriented.   Ottie Glazier, M.D.  Pulmonary & [place]

## 2020-09-19 NOTE — ED Notes (Addendum)
Third bag of prbc's and 1st bag of ffp finished at this time.   Fourth and final bag of prc's hung by this rn and mckenzie rn.  Temp 98.6 Oxygen 100% 2l [place] [facility] 65 bp 126/85

## 2020-09-19 NOTE — H&P (Signed)
NAMEKacy Gonzalez, MRN:  [id number], DOB:  1996-05-06, LOS: 0 ADMISSION DATE:  09/18/2020, CONSULTATION DATE:  09/18/2020 REFERRING MD:  Dr. Katrinka Blazing, CHIEF COMPLAINT:  GI Bleeding  Brief History   25 y.o. Female admitted with Hemorrhagic shock in the setting of Acute on Chronic GI bleed.  History of present illness   Meagan Gonzalez is a 24 y.o. Female with a past medical history significant for chronic iron deficiency anemia secondary to chronic GI bleeding for which she is followed by [facility] gastroenterology.  She presented to [facility] ED on 09/18/2020 due to 3 days of persistent rectal bleeding, along with associated shortness of breath, lightheadedness, nausea, and generalized abdominal pain.  She reports off-and-on bright red blood per rectum for months.  Over the last couple days it has been persistent with bright red blood filling the toilet every time she uses it along with bleeding in her underwear in between bathroom episodes.  She denied chest pain, headache, fever, chills, recent traumatic injuries.  She also denies any NSAID use, EtOH abuse, anticoagulants, or illicit drug use.  Of note she reports she has been worked up extensively by [facility] and they have been unable to tell why she has been bleeding.  Upon EMS arrival she was noted to be tachycardic with heart rate 170, hypotensive with blood pressure 73/49.  She was placed on non-rebreather mask and given 600 cc normal saline bolus.    ED course: Upon arrival to the ED she was noted to be tachycardic, tachypneic, with borderline low blood pressures.  She was immediately started on massive transfusion protocol.  Work-up revealed hemoglobin 4.4, hematocrit 10.4, MCV 70.2, MCH 17.7, MCHC 25.3, RDW 18.3, platelets 369, fibrinogen 113, prothrombin time 15.8, INR 1.3, potassium 3.4, bicarb 17, anion gap 15, BUN 13, creatinine 0.81.  Chest x-ray is negative for any acute process, and CTA abdomen and pelvis without any findings to explain  the patient's GI bleeding, did show bilateral symmetrical enlarged ovaries with suggestion of multiple follicles possibly indicating underlying PCOS.  Her COVID-19 PCR and influenza PCR both negative.  Urinalysis is negative and urine pregnancy test is negative.  ED provider discussed with on-call gastroenterologist Dr. Daleen Squibb who recommended continued resuscitation with PRBCs and admission to ICU with plan to see patient in the morning. She has currently received 3 units pRBC's and 1 unit FFP, and is set to receive 1 more unit pRBC's.  PCCM is asked to admit the patient to ICU for further work-up and treatment of hemorrhagic shock in the setting of acute on chronic GI bleeding.  Past Medical History  GI Bleed  Significant Hospital Events   11/12: Presented to ED  Consults:  PCCM  GI  Procedures:  N/A  Significant Diagnostic Tests:  11/12: CXR>>The heart size and mediastinal contours are within normal limits. Both lungs are clear. The visualized skeletal structures are unremarkable. 11/12: CTA Abdomen & Pelvis>>1. No findings to explain the patient's GI bleeding. 2. Bilateral symmetrically enlarged ovaries with suggestion of multiple follicles. This may indicate underlying PCOS. 3. Borderline splenomegaly. 4. Anemia.  Micro Data:  11/12: SARS-CoV-2 PCR>>negative 11/12: Influenza PCR>>negative  Antimicrobials:  N/A  Interim history/subjective:  Has received 3 units pRBC's & 1 unit FFP per Massive Transfusion Protocol; to receive 1 more unit of pRBC's BP and HR improving On 2L Nasal Cannula Reports hematochezia, nausea, and abdominal pain   Objective   Blood pressure 96/68, pulse 74, temperature 98.4 F (36.9 C), resp. rate 18, height  5\' 5"  (1.651 m), weight 61.7 kg, SpO2 100 %.       No intake or output data in the 24 hours ending 09/19/20 0009 Filed Weights   09/18/20 2129  Weight: 61.7 kg    Examination: General: Acutely ill appearing female, laying in bed, on 2L  [place], in NAD HENT: Atraumatic, normocephalic, neck supple, no JVD Lungs: Clear to auscultation bilaterally, no wheezing or rales, even, nonlabored Cardiovascular: Regular rate and rhythm, s1s2, no M/R/G, 2+ distal pulses Abdomen: soft,tender, nondistended, no guarding or rebound tenderness, BS hypoactive Extremities: Normal bulk and tone, no deformities, no edema Neuro: Awake, A&O x4, no focal deficits, speech clear  Resolved Hospital Problem list   N/A  Assessment & Plan:   Hemorrhagic Shock due to GI bleed -Continuous Cardiac monitoring -Maintain MAP >65 -IV Fluids -Transfusions as indicated -Vasopressors if needed to maintain MAP goal -Trend lactic acid   Acute Blood Loss Anemia in setting of Acute on Chronic GI Bleed Hx: Iron Deficiency Anemia -Monitor for S/Sx of bleeding -Trend CBC (H&H q6h) -SCD's for VTE Prophylaxis (no chemical prophylaxis) -Transfuse for Hgb <8 -CTA Abdomen & Pelvis on 09/18/2020 without findings to explain GI bleeding -IV Protonix BID -Keep NPO -GI consulted, appreciate input  -Check bleeding times and Fibrinogen   Hypokalemia Anion Gap Metabolic Acidosis, likely secondary to lactic acidosis in setting of hemorrhage -Monitor I&O's / urinary output -Follow BMP -Ensure adequate renal perfusion -Avoid nephrotoxic agents as able -Replace electrolytes as indicated -Trend lactic acid   Best practice:  Diet: NPO Pain/Anxiety/Delirium protocol (if indicated): N/A VAP protocol (if indicated): N/A DVT prophylaxis: SCD's (no chemical prophylaxis given bleed) GI prophylaxis: Protonix Glucose control: N/A Mobility: As tolerated Code Status: Full Code Family Communication: Updated pt at bedside 09/19/2020, and updated pt's mother via telephone 09/19/2020 Disposition: ICU  Labs   CBC: Recent Labs  Lab 09/18/20 2124 09/18/20 2229  WBC 9.0  --   NEUTROABS 7.2  --   HGB 4.4* 4.1*  HCT 17.4* 15.6*  MCV 70.2*  --   PLT 369  --     Basic  Metabolic Panel: Recent Labs  Lab 09/18/20 2124  NA 137  K 3.4*  CL 105  CO2 17*  GLUCOSE 111*  BUN 13  CREATININE 0.81  CALCIUM 8.2*   GFR: Estimated Creatinine Clearance: 97.2 mL/min (by C-G formula based on SCr of 0.81 mg/dL). Recent Labs  Lab 09/18/20 2124  WBC 9.0    Liver Function Tests: Recent Labs  Lab 09/18/20 2124  AST 25  ALT 10  ALKPHOS 42  BILITOT 0.8  PROT 6.5  ALBUMIN 3.8   No results for input(s): LIPASE, AMYLASE in the last 168 hours. No results for input(s): AMMONIA in the last 168 hours.  ABG No results found for: PHART, PCO2ART, PO2ART, HCO3, TCO2, ACIDBASEDEF, O2SAT   Coagulation Profile: Recent Labs  Lab 09/18/20 2130  INR 1.3*    Cardiac Enzymes: No results for input(s): CKTOTAL, CKMB, CKMBINDEX, TROPONINI in the last 168 hours.  HbA1C: No results found for: HGBA1C  CBG: No results for input(s): GLUCAP in the last 168 hours.  Review of Systems:   Positives in BOLD: Gen: Denies fever, chills, weight change, fatigue, night sweats HEENT: Denies blurred vision, double vision, hearing loss, tinnitus, sinus congestion, rhinorrhea, sore throat, neck stiffness, dysphagia PULM: Denies +shortness of breath, cough, sputum production, hemoptysis, wheezing CV: Denies chest pain, edema, orthopnea, paroxysmal nocturnal dyspnea, palpitations GI: Denies +abdominal pain, +nausea, vomiting, diarrhea, +hematochezia, melena,  constipation, change in bowel habits GU: Denies dysuria, hematuria, polyuria, oliguria, urethral discharge Endocrine: Denies hot or cold intolerance, polyuria, polyphagia or appetite change Derm: Denies rash, dry skin, scaling or peeling skin change Heme: Denies easy bruising, bleeding, bleeding gums Neuro: Denies headache, numbness, weakness, slurred speech, loss of memory or consciousness   Past Medical History  She,  has no past medical history on file.   Surgical History   History reviewed. No pertinent surgical  history.   Social History   reports that she has never smoked. She has never used smokeless tobacco. She reports current alcohol use. She reports current drug use. Drug: Marijuana.   Family History   Her family history is not on file.   Allergies No Known Allergies   Home Medications  Prior to Admission medications   Medication Sig Start Date End Date Taking? Authorizing Provider  amoxicillin (AMOXIL) 500 MG capsule Take 1 capsule (500 mg total) by mouth 3 (three) times daily. 11/12/18   Janne Napoleon, NP     Critical care time: 50 minutes      Harlon Ditty, [facility] [place] [facility] Medicine Pager: [phone]

## 2020-09-19 NOTE — ED Notes (Signed)
Pt reports at least 6 episodes bloody stool since ED arrival. Contents continue to be bright red blood. Pt reports feeling a lot less weak, VSS at this time. Continuing to monitor.

## 2020-09-20 LAB — BPAM RBC
Blood Product Expiration Date: 202112132359
Blood Product Expiration Date: 202112162359
Blood Product Expiration Date: 202112162359
Blood Product Expiration Date: 202112172359
ISSUE DATE / TIME: 202111122132
ISSUE DATE / TIME: 202111122132
ISSUE DATE / TIME: 202111122132
ISSUE DATE / TIME: 202111122132
Unit Type and Rh: 9500
Unit Type and Rh: 9500
Unit Type and Rh: 9500
Unit Type and Rh: 9500

## 2020-09-20 LAB — TYPE AND SCREEN
Unit division: 0
Unit division: 0
Unit division: 0

## 2020-09-20 LAB — MASSIVE TRANSFUSION PROTOCOL ORDER (BLOOD BANK NOTIFICATION)

## 2020-09-21 LAB — PATHOLOGIST SMEAR REVIEW

## 2020-09-22 LAB — BPAM FFP
Blood Product Expiration Date: 202111172359
Blood Product Expiration Date: 202111172359
Blood Product Expiration Date: 202111172359
Blood Product Expiration Date: 202111172359
ISSUE DATE / TIME: 202111122311
ISSUE DATE / TIME: 202111151008
ISSUE DATE / TIME: 202111151129
Unit Type and Rh: 2800
Unit Type and Rh: 8400
Unit Type and Rh: 8400
Unit Type and Rh: 8400

## 2020-09-22 LAB — PREPARE FRESH FROZEN PLASMA
Unit division: 0
Unit division: 0

## 2020-09-23 LAB — PREPARE RBC (CROSSMATCH)

## 2021-01-11 ENCOUNTER — Other Ambulatory Visit: Payer: Self-pay | Admitting: Sports Medicine

## 2021-01-11 DIAGNOSIS — M25862 Other specified joint disorders, left knee: Secondary | ICD-10-CM

## 2021-01-11 DIAGNOSIS — M25562 Pain in left knee: Secondary | ICD-10-CM

## 2021-01-11 DIAGNOSIS — G8929 Other chronic pain: Secondary | ICD-10-CM

## 2021-01-20 ENCOUNTER — Other Ambulatory Visit: Payer: Self-pay | Admitting: Physician Assistant

## 2021-01-20 DIAGNOSIS — M25559 Pain in unspecified hip: Secondary | ICD-10-CM

## 2021-01-20 DIAGNOSIS — M25551 Pain in right hip: Secondary | ICD-10-CM

## 2021-02-09 ENCOUNTER — Other Ambulatory Visit: Payer: Self-pay

## 2021-02-09 ENCOUNTER — Ambulatory Visit
Admission: RE | Admit: 2021-02-09 | Discharge: 2021-02-09 | Disposition: A | Discharge status: Home / Self Care | Payer: 59 | Source: Ambulatory Visit | Attending: Physician Assistant | Admitting: Physician Assistant

## 2021-02-09 ENCOUNTER — Ambulatory Visit
Admission: RE | Admit: 2021-02-09 | Discharge: 2021-02-09 | Disposition: A | Discharge status: Home / Self Care | Payer: 59 | Source: Home / Self Care | Attending: Physician Assistant | Admitting: Physician Assistant

## 2021-02-09 ENCOUNTER — Ambulatory Visit
Admission: RE | Admit: 2021-02-09 | Discharge: 2021-02-09 | Disposition: A | Discharge status: Home / Self Care | Payer: 59 | Source: Ambulatory Visit | Attending: Sports Medicine | Admitting: Sports Medicine

## 2021-02-09 DIAGNOSIS — M25559 Pain in unspecified hip: Secondary | ICD-10-CM

## 2021-02-09 DIAGNOSIS — M25561 Pain in right knee: Secondary | ICD-10-CM | POA: Diagnosis present

## 2021-02-09 DIAGNOSIS — M25551 Pain in right hip: Secondary | ICD-10-CM

## 2021-02-09 DIAGNOSIS — G8929 Other chronic pain: Secondary | ICD-10-CM | POA: Insufficient documentation

## 2021-02-09 DIAGNOSIS — M25862 Other specified joint disorders, left knee: Secondary | ICD-10-CM | POA: Insufficient documentation

## 2021-02-09 DIAGNOSIS — M25562 Pain in left knee: Secondary | ICD-10-CM | POA: Diagnosis present

## 2021-02-18 ENCOUNTER — Other Ambulatory Visit: Payer: Self-pay | Admitting: Emergency Medicine

## 2021-02-18 ENCOUNTER — Other Ambulatory Visit: Payer: Self-pay

## 2021-02-18 ENCOUNTER — Emergency Department
Admission: EM | Admit: 2021-02-18 | Discharge: 2021-02-18 | Disposition: A | Discharge status: Home / Self Care | Payer: 59 | Attending: Emergency Medicine | Admitting: Emergency Medicine

## 2021-02-18 ENCOUNTER — Encounter: Payer: Self-pay | Admitting: Emergency Medicine

## 2021-02-18 DIAGNOSIS — M545 Low back pain, unspecified: Secondary | ICD-10-CM | POA: Diagnosis present

## 2021-02-18 DIAGNOSIS — M5441 Lumbago with sciatica, right side: Secondary | ICD-10-CM | POA: Diagnosis not present

## 2021-02-18 DIAGNOSIS — M4646 Discitis, unspecified, lumbar region: Secondary | ICD-10-CM

## 2021-02-18 DIAGNOSIS — M5431 Sciatica, right side: Secondary | ICD-10-CM

## 2021-02-18 DIAGNOSIS — F17210 Nicotine dependence, cigarettes, uncomplicated: Secondary | ICD-10-CM | POA: Insufficient documentation

## 2021-02-18 LAB — POC URINE PREG, ED: Preg Test, Ur: NEGATIVE

## 2021-02-18 MED ORDER — NAPROXEN 500 MG PO TABS
500.0000 mg | ORAL_TABLET | Freq: Once | ORAL | Status: AC
  Administered 2021-02-18: 500 mg via ORAL
  Filled 2021-02-18: qty 1

## 2021-02-18 MED ORDER — ACETAMINOPHEN 500 MG PO TABS
1000.0000 mg | ORAL_TABLET | Freq: Once | ORAL | Status: AC
  Administered 2021-02-18: 1000 mg via ORAL
  Filled 2021-02-18: qty 2

## 2021-02-18 MED ORDER — LIDOCAINE 5 % EX PTCH: 1.0000 | MEDICATED_PATCH | Freq: Two times a day (BID) | CUTANEOUS | 0 refills | Status: DC

## 2021-02-18 MED ORDER — METHOCARBAMOL 500 MG PO TABS
500.0000 mg | ORAL_TABLET | Freq: Once | ORAL | Status: AC
  Administered 2021-02-18: 500 mg via ORAL
  Filled 2021-02-18: qty 1

## 2021-02-18 MED ORDER — LIDOCAINE 5 % EX PTCH
1.0000 | MEDICATED_PATCH | CUTANEOUS | Status: DC
  Administered 2021-02-18: 1 via TRANSDERMAL
  Filled 2021-02-18: qty 1

## 2021-02-18 MED ORDER — METHOCARBAMOL 500 MG PO TABS: 500.0000 mg | ORAL_TABLET | Freq: Four times a day (QID) | ORAL | 0 refills | Status: DC | PRN

## 2021-02-18 NOTE — ED Notes (Signed)
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

## 2021-02-18 NOTE — ED Triage Notes (Signed)
Pt comes into the eD via POV c/o low back pain that goes down the hips and into the leg (mostly the right).  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  Pt ambulatory to triage.
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
## 2021-02-18 NOTE — ED Notes (Signed)
D/C and new RX and to f/up with MRI discussed with pt, pt was speaking to MRI to schedule one ASAP. NAD noted. Pt ambualtory on D/C with steady gait.

## 2021-02-18 NOTE — Discharge Instructions (Addendum)
Please take Tylenol and ibuprofen/Advil for your pain.  It is safe to take them together, or to alternate them every few hours.  Take up to 1000mg  of Tylenol at a time, up to 4 times per day.  Do not take more than 4000 mg of Tylenol in 24 hours.  For ibuprofen, take 400-600 mg, 4-5 times per day.  I sent a prescription for both Robaxin muscle relaxer and lidocaine patches to the pharmacy for you.  Please use all of these medications in combination to help with your symptoms.  Follow-up with your MRI. If you develop any fevers with your symptoms, inability to walk, inability to use the bathroom or feel your groin, please return to the ED.

## 2021-02-25 ENCOUNTER — Ambulatory Visit: Payer: 59

## 2021-03-02 ENCOUNTER — Emergency Department: Payer: 59

## 2021-03-02 ENCOUNTER — Emergency Department
Admission: EM | Admit: 2021-03-02 | Discharge: 2021-03-02 | Disposition: A | Discharge status: Home / Self Care | Payer: 59 | Attending: Emergency Medicine | Admitting: Emergency Medicine

## 2021-03-02 ENCOUNTER — Other Ambulatory Visit: Payer: Self-pay

## 2021-03-02 ENCOUNTER — Encounter: Payer: Self-pay | Admitting: Emergency Medicine

## 2021-03-02 DIAGNOSIS — G8929 Other chronic pain: Secondary | ICD-10-CM

## 2021-03-02 DIAGNOSIS — M79601 Pain in right arm: Secondary | ICD-10-CM

## 2021-03-02 DIAGNOSIS — M5441 Lumbago with sciatica, right side: Secondary | ICD-10-CM | POA: Diagnosis not present

## 2021-03-02 DIAGNOSIS — M542 Cervicalgia: Secondary | ICD-10-CM | POA: Diagnosis not present

## 2021-03-02 DIAGNOSIS — M25511 Pain in right shoulder: Secondary | ICD-10-CM | POA: Insufficient documentation

## 2021-03-02 DIAGNOSIS — F17210 Nicotine dependence, cigarettes, uncomplicated: Secondary | ICD-10-CM | POA: Diagnosis not present

## 2021-03-02 DIAGNOSIS — M5442 Lumbago with sciatica, left side: Secondary | ICD-10-CM | POA: Diagnosis not present

## 2021-03-02 LAB — POC URINE PREG, ED: Preg Test, Ur: NEGATIVE

## 2021-03-02 MED ORDER — ACETAMINOPHEN 325 MG PO TABS
650.0000 mg | ORAL_TABLET | Freq: Once | ORAL | Status: AC
  Administered 2021-03-02: 650 mg via ORAL
  Filled 2021-03-02: qty 2

## 2021-03-02 MED ORDER — METHOCARBAMOL 500 MG PO TABS
750.0000 mg | ORAL_TABLET | Freq: Once | ORAL | Status: AC
  Administered 2021-03-02: 750 mg via ORAL
  Filled 2021-03-02: qty 2

## 2021-03-02 MED ORDER — METHOCARBAMOL 750 MG PO TABS: 750.0000 mg | ORAL_TABLET | Freq: Four times a day (QID) | ORAL | 0 refills | Status: AC | PRN

## 2021-03-02 MED ORDER — PREDNISONE 20 MG PO TABS
60.0000 mg | ORAL_TABLET | Freq: Once | ORAL | Status: AC
  Administered 2021-03-02: 60 mg via ORAL
  Filled 2021-03-02: qty 3

## 2021-03-02 MED ORDER — PREDNISONE 10 MG PO TABS: ORAL_TABLET | ORAL | 0 refills | Status: AC
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
## 2021-03-02 NOTE — Discharge Instructions (Signed)
Please take the steroid taper as prescribed.  You may also use the muscle relaxant as prescribed.  You can also use Tylenol, up to 1000 mg 4 times daily as needed for pain. Please follow-up with your previous primary care provider who was pfollowing your back pain, or you can follow-up with the on-call neurosurgeon, Dr. Adriana Simas.  His information is above.  Return to the ER if you develop any fever, loss of bowel or bladder control or other acute changes.

## 2021-03-02 NOTE — ED Triage Notes (Signed)
Presents with right arm pain  States pain is moving from her neck down her arm  Also having some discomfort to left arm  But right is worse    Also states her pain is shooting down her legs  ambulates well  Has been seen for same in past

## 2021-03-02 NOTE — ED Triage Notes (Signed)
First Nurse Note:  Arrives with multiple complaints.  C/o persistnat back pain and now pain also radiates up toward arms and shooting down arms and legs  AAOx3.  Skin warm and dry.  MAE equally and strong.  Gait steady.  Posture upright and relaxed.  NAD

## 2021-03-05 ENCOUNTER — Ambulatory Visit: Payer: 59

## 2021-03-09 ENCOUNTER — Other Ambulatory Visit: Payer: Self-pay | Admitting: Physical Medicine & Rehabilitation

## 2021-03-09 DIAGNOSIS — G8929 Other chronic pain: Secondary | ICD-10-CM

## 2021-03-12 ENCOUNTER — Ambulatory Visit: Payer: 59

## 2021-03-22 ENCOUNTER — Other Ambulatory Visit: Payer: Self-pay

## 2021-03-22 ENCOUNTER — Ambulatory Visit
Admission: RE | Admit: 2021-03-22 | Discharge: 2021-03-22 | Disposition: A | Discharge status: Home / Self Care | Payer: 59 | Source: Ambulatory Visit | Attending: Physical Medicine & Rehabilitation | Admitting: Physical Medicine & Rehabilitation

## 2021-03-22 DIAGNOSIS — G8929 Other chronic pain: Secondary | ICD-10-CM | POA: Insufficient documentation

## 2021-03-22 DIAGNOSIS — M5441 Lumbago with sciatica, right side: Secondary | ICD-10-CM | POA: Diagnosis present

## 2021-03-22 DIAGNOSIS — M5442 Lumbago with sciatica, left side: Secondary | ICD-10-CM

## 2021-03-26 ENCOUNTER — Other Ambulatory Visit: Payer: Self-pay | Admitting: Orthopedic Surgery

## 2021-03-26 ENCOUNTER — Other Ambulatory Visit (HOSPITAL_COMMUNITY): Payer: Self-pay | Admitting: Orthopedic Surgery

## 2021-03-26 DIAGNOSIS — M76892 Other specified enthesopathies of left lower limb, excluding foot: Secondary | ICD-10-CM

## 2021-05-11 ENCOUNTER — Other Ambulatory Visit: Payer: Self-pay

## 2021-05-11 ENCOUNTER — Emergency Department
Admission: EM | Admit: 2021-05-11 | Discharge: 2021-05-11 | Disposition: A | Discharge status: Home / Self Care | Payer: 59 | Attending: Emergency Medicine | Admitting: Emergency Medicine

## 2021-05-11 DIAGNOSIS — L237 Allergic contact dermatitis due to plants, except food: Secondary | ICD-10-CM | POA: Diagnosis present

## 2021-05-11 DIAGNOSIS — F17210 Nicotine dependence, cigarettes, uncomplicated: Secondary | ICD-10-CM | POA: Diagnosis not present

## 2021-05-11 MED ORDER — PREDNISONE 10 MG PO TABS: 10.0000 mg | ORAL_TABLET | Freq: Every day | ORAL | 0 refills | Status: DC

## 2021-05-11 NOTE — ED Triage Notes (Signed)
Pt c/o itchy rash to BL LE and arms for the past week, states she thinks she got into some poison ivy while weedeating

## 2021-05-11 NOTE — ED Provider Notes (Signed)
[facility] EMERGENCY DEPARTMENT Provider Note   CSN: [id number] Arrival date & time: 05/11/21  1436     History Chief Complaint  Patient presents with   Poison Ivy    Meagan Gonzalez is a 25 y.o. female presents to the emergency department evaluation of poison ivy.  Poison ivy has been present for 1 week.  She developed this while weed eating.  She has had erythematous pruritic rash on her legs and upper extremities and abdomen.  She denies any fevers.  She has been using some calamine lotion with some mild relief.  She denies any facial rashes or irritation of the eyes.  HPI     History reviewed. No pertinent past medical history.  Patient Active Problem List   Diagnosis Date Noted   GI bleed 09/19/2020   Hemorrhagic shock (HCC)     History reviewed. No pertinent surgical history.   OB History     Gravida  1   Para  0   Term  0   Preterm  0   AB  1   Living         SAB  0   IAB  0   Ectopic  0   Multiple      Live Births              No family history on file.  Social History   Tobacco Use   Smoking status: Every Day    Pack years: 0.00    Types: E-cigarettes, Cigarettes   Smokeless tobacco: Never  Vaping Use   Vaping Use: Some days   Substances: Nicotine, THC, Synthetic cannabinoids  Substance Use Topics   Alcohol use: Yes   Drug use: Yes    Types: Marijuana    Home Medications Prior to Admission medications   Medication Sig Start Date End Date Taking? Authorizing Provider  predniSONE (DELTASONE) 10 MG tablet Take 1 tablet (10 mg total) by mouth daily. 6,5,4,3,2,1 six day taper 05/11/21  Yes Evon Slack, PA-C  Probiotic Product (FORTIFY PROBIOTIC WOMENS EX ST PO) Take 1 capsule by mouth daily.    [provider]    Allergies    Patient has no known allergies.  Review of Systems   Review of Systems  Constitutional:  Negative for fever.  Eyes:  Negative for pain, redness and itching.   Skin:  Positive for rash. Negative for wound.   Physical Exam Updated Vital Signs BP 114/60   Pulse 94   Temp 98 F (36.7 C)   Resp 18   Ht 5\' 5"  (1.651 m)   Wt 62.6 kg   SpO2 100%   BMI 22.97 kg/m   Physical Exam Constitutional:      Appearance: She is well-developed.  HENT:     Head: Normocephalic and atraumatic.  Eyes:     Extraocular Movements: Extraocular movements intact.     Conjunctiva/sclera: Conjunctivae normal.  Cardiovascular:     Rate and Rhythm: Normal rate.  Pulmonary:     Effort: Pulmonary effort is normal. No respiratory distress.  Musculoskeletal:        General: Normal range of motion.     Cervical back: Normal range of motion.  Skin:    Findings: Rash present.     Comments: Erythematous pruritic maculopapular rash scattered throughout the lower legs, abdomen and arms consistent with contact dermatitis.  No signs of cellulitis or induration.  Neurological:     Mental Status: She is alert and  oriented to person, place, and time.  Psychiatric:        Behavior: Behavior normal.        Thought Content: Thought content normal.    ED Results / Procedures / Treatments   Labs (all labs ordered are listed, but only abnormal results are displayed) Labs Reviewed - No data to display  EKG None  Radiology No results found.  Procedures Procedures   Medications Ordered in ED Medications - No data to display  ED Course  I have reviewed the triage vital signs and the nursing notes.  Pertinent labs & imaging results that were available during my care of the patient were reviewed by me and considered in my medical decision making (see chart for details).    MDM Rules/Calculators/A&P                          25 year old female with contact dermatitis likely poison ivy.  She is placed on a 6-day steroid taper and will continue with calamine lotion she is educated on other conservative treatment options and understands signs symptoms return to the ER  for. Final Clinical Impression(s) / ED Diagnoses Final diagnoses:  Poison ivy dermatitis    Rx / DC Orders ED Discharge Orders          Ordered    predniSONE (DELTASONE) 10 MG tablet  Daily        05/11/21 1620             Ronnette Juniper 05/11/21 1623    Sharman Cheek, MD 05/11/21 2109

## 2021-05-11 NOTE — Discharge Instructions (Addendum)
Please continue with calamine lotion, take prednisone as prescribed.  Return to the ER for any worsening symptoms or urgent changes in your health

## 2021-05-17 ENCOUNTER — Ambulatory Visit: Payer: 59

## 2021-05-17 ENCOUNTER — Other Ambulatory Visit: Payer: 59

## 2021-06-16 ENCOUNTER — Emergency Department: Payer: 59

## 2021-06-16 ENCOUNTER — Emergency Department
Admission: EM | Admit: 2021-06-16 | Discharge: 2021-06-17 | Disposition: A | Discharge status: Home / Self Care | Payer: 59 | Attending: Emergency Medicine | Admitting: Emergency Medicine

## 2021-06-16 ENCOUNTER — Other Ambulatory Visit: Payer: Self-pay

## 2021-06-16 DIAGNOSIS — R112 Nausea with vomiting, unspecified: Secondary | ICD-10-CM | POA: Insufficient documentation

## 2021-06-16 DIAGNOSIS — F17210 Nicotine dependence, cigarettes, uncomplicated: Secondary | ICD-10-CM | POA: Diagnosis not present

## 2021-06-16 DIAGNOSIS — R1030 Lower abdominal pain, unspecified: Secondary | ICD-10-CM

## 2021-06-16 DIAGNOSIS — R102 Pelvic and perineal pain: Secondary | ICD-10-CM | POA: Diagnosis present

## 2021-06-16 DIAGNOSIS — N83209 Unspecified ovarian cyst, unspecified side: Secondary | ICD-10-CM

## 2021-06-16 DIAGNOSIS — N83201 Unspecified ovarian cyst, right side: Secondary | ICD-10-CM | POA: Diagnosis not present

## 2021-06-16 HISTORY — DX: Unspecified ovarian cyst, unspecified side: N83.209

## 2021-06-16 LAB — URINALYSIS, COMPLETE (UACMP) WITH MICROSCOPIC
Bilirubin Urine: NEGATIVE
Glucose, UA: NEGATIVE mg/dL
Hgb urine dipstick: NEGATIVE
Ketones, ur: NEGATIVE mg/dL
Leukocytes,Ua: NEGATIVE
Nitrite: NEGATIVE
Protein, ur: NEGATIVE mg/dL
Specific Gravity, Urine: 1.005 (ref 1.005–1.030)
pH: 9 — ABNORMAL HIGH (ref 5.0–8.0)

## 2021-06-16 LAB — COMPREHENSIVE METABOLIC PANEL
ALT: 17 U/L (ref 0–44)
AST: 21 U/L (ref 15–41)
Albumin: 4.5 g/dL (ref 3.5–5.0)
Alkaline Phosphatase: 41 U/L (ref 38–126)
Anion gap: 13 (ref 5–15)
BUN: 8 mg/dL (ref 6–20)
CO2: 25 mmol/L (ref 22–32)
Calcium: 9.1 mg/dL (ref 8.9–10.3)
Chloride: 102 mmol/L (ref 98–111)
Creatinine, Ser: 0.76 mg/dL (ref 0.44–1.00)
GFR, Estimated: >60 mL/min (ref >60)
Glucose, Bld: 98 mg/dL (ref 70–99)
Potassium: 3.6 mmol/L (ref 3.5–5.1)
Sodium: 140 mmol/L (ref 135–145)
Total Bilirubin: 0.7 mg/dL (ref 0.3–1.2)
Total Protein: 7.6 g/dL (ref 6.5–8.1)

## 2021-06-16 LAB — CBC
HCT: 40.7 % (ref 36.0–46.0)
Hemoglobin: 13.1 g/dL (ref 12.0–15.0)
MCH: 26.6 pg (ref 26.0–34.0)
MCHC: 32.2 g/dL (ref 30.0–36.0)
MCV: 82.6 fL (ref 80.0–100.0)
Platelets: 291 10*3/uL (ref 150–400)
RBC: 4.93 MIL/uL (ref 3.87–5.11)
RDW: 16.7 % — ABNORMAL HIGH (ref 11.5–15.5)
WBC: 8.5 10*3/uL (ref 4.0–10.5)
nRBC: 0 % (ref 0.0–0.2)

## 2021-06-16 LAB — CHLAMYDIA/NGC RT PCR (ARMC ONLY)
Chlamydia Tr: NOT DETECTED
N gonorrhoeae: NOT DETECTED

## 2021-06-16 LAB — WET PREP, GENITAL
Clue Cells Wet Prep HPF POC: NONE SEEN
Sperm: NONE SEEN
Trich, Wet Prep: NONE SEEN
Yeast Wet Prep HPF POC: NONE SEEN

## 2021-06-16 LAB — LIPASE, BLOOD: Lipase: 25 U/L (ref 11–51)

## 2021-06-16 LAB — PREGNANCY, URINE: Preg Test, Ur: NEGATIVE

## 2021-06-16 MED ORDER — HYDROCODONE-ACETAMINOPHEN 5-325 MG PO TABS: 1.0000 | ORAL_TABLET | ORAL | 0 refills | Status: DC | PRN

## 2021-06-16 MED ORDER — FUROSEMIDE 10 MG/ML IJ SOLN: 20.0000 mg | Freq: Once | INTRAMUSCULAR | Status: DC

## 2021-06-16 NOTE — ED Provider Notes (Signed)
[facility] Emergency Department Provider Note   ____________________________________________   Event Date/Time   First MD Initiated Contact with Patient 06/16/21 1214     (approximate)  I have reviewed the triage vital signs and the nursing notes.   HISTORY  Chief Complaint Abdominal Pain    HPI Meagan Gonzalez is a 25 y.o. female who presents for pelvic pain  LOCATION: Suprapubic region DURATION: 24 hours prior to arrival TIMING: Slightly improved since onset SEVERITY: 8/10 QUALITY: Aching/burning CONTEXT: Patient states that 1 week prior to arrival she had pain during intercourse that resolved spontaneously.  Patient states that yesterday she had significant pain after sexual intercourse that was also associated with nausea/vomiting that has not improved since onset MODIFYING FACTORS: Sexual intercourse worsens this pain and patient denies any relieving factors ASSOCIATED SYMPTOMS: Nausea/vomiting   Per medical record review, patient does has history of ovarian cyst          Past Medical History:  Diagnosis Date   Ovarian cyst     Patient Active Problem List   Diagnosis Date Noted   GI bleed 09/19/2020   Hemorrhagic shock (HCC)     No past surgical history on file.  Prior to Admission medications   Medication Sig Start Date End Date Taking? Authorizing Provider  HYDROcodone-acetaminophen (NORCO) 5-325 MG tablet Take 1 tablet by mouth every 4 (four) hours as needed for moderate pain. 06/16/21  Yes Merwyn Katos, MD  predniSONE (DELTASONE) 10 MG tablet Take 1 tablet (10 mg total) by mouth daily. 6,5,4,3,2,1 six day taper 05/11/21   Evon Slack, PA-C  Probiotic Product (FORTIFY PROBIOTIC WOMENS EX ST PO) Take 1 capsule by mouth daily.    [provider]    Allergies Patient has no known allergies.  No family history on file.  Social History Social History   Tobacco Use   Smoking status: Every Day    Types:  E-cigarettes, Cigarettes   Smokeless tobacco: Never  Vaping Use   Vaping Use: Some days   Substances: Nicotine, THC, Synthetic cannabinoids  Substance Use Topics   Alcohol use: Yes   Drug use: Yes    Types: Marijuana    Review of Systems Constitutional: No fever/chills Eyes: No visual changes. ENT: No sore throat. Cardiovascular: Denies chest pain. Respiratory: Denies shortness of breath. Gastrointestinal: Endorses suprapubic abdominal pain, nausea, and vomiting.  No diarrhea. Genitourinary: Negative for dysuria. Musculoskeletal: Negative for acute arthralgias Skin: Negative for rash. Neurological: Negative for headaches, weakness/numbness/paresthesias in any extremity Psychiatric: Negative for suicidal ideation/homicidal ideation   ____________________________________________   PHYSICAL EXAM:  VITAL SIGNS: ED Triage Vitals  Enc Vitals Group     BP 06/16/21 1033 125/79     Pulse Rate 06/16/21 1033 97     Resp 06/16/21 1033 18     Temp 06/16/21 1033 98.5 F (36.9 C)     Temp Source 06/16/21 1033 Oral     SpO2 06/16/21 1033 100 %     Weight 06/16/21 1042 140 lb (63.5 kg)     Height 06/16/21 1042 5\' 5"  (1.651 m)     Head Circumference --      Peak Flow --      Pain Score 06/16/21 1042 8     Pain Loc --      Pain Edu? --      Excl. in GC? --    Constitutional: Alert and oriented. Well appearing and in no acute distress. Eyes: Conjunctivae are normal. PERRL.  Head: Atraumatic. Nose: No congestion/rhinnorhea. Mouth/Throat: Mucous membranes are moist. Neck: No stridor Cardiovascular: Grossly normal heart sounds.  Good peripheral circulation. Respiratory: Normal respiratory effort.  No retractions. Gastrointestinal: Soft and tender to palpation in bilateral lower abdominal quadrants. No distention. Musculoskeletal: No obvious deformities Neurologic:  Normal speech and language. No gross focal neurologic deficits are appreciated. Skin:  Skin is warm and dry. No rash  noted. Psychiatric: Mood and affect are normal. Speech and behavior are normal.  ____________________________________________   LABS (all labs ordered are listed, but only abnormal results are displayed)  Labs Reviewed  WET PREP, GENITAL - Abnormal; Notable for the following components:      Result Value   WBC, Wet Prep HPF POC MODERATE (*)    All other components within normal limits  CBC - Abnormal; Notable for the following components:   RDW 16.7 (*)    All other components within normal limits  URINALYSIS, COMPLETE (UACMP) WITH MICROSCOPIC - Abnormal; Notable for the following components:   Color, Urine STRAW (*)    APPearance CLEAR (*)    pH 9.0 (*)    Bacteria, UA RARE (*)    All other components within normal limits  CHLAMYDIA/NGC RT PCR ([facility] ONLY)            LIPASE, BLOOD  COMPREHENSIVE METABOLIC PANEL  PREGNANCY, URINE  POC URINE PREG, ED    RADIOLOGY  ED MD interpretation: Ultrasound of the pelvis shows a 3.9 cm hemorrhagic cyst on the right ovary  Official radiology report(s): US PELVIC COMPLETE W TRANSVAGINAL AND TORSION R/O  Result Date: 06/16/2021 CLINICAL DATA:  Pelvic pain for 2 weeks after sex. EXAM: TRANSABDOMINAL ULTRASOUND OF PELVIS DOPPLER ULTRASOUND OF OVARIES TECHNIQUE: Transabdominal ultrasound examination of the pelvis was performed including evaluation of the uterus, ovaries, adnexal regions, and pelvic cul-de-sac. Color and duplex Doppler ultrasound was utilized to evaluate blood flow to the ovaries. COMPARISON:  None. FINDINGS: Uterus Measurements: 9.1 x 4.2 x 2.2 cm = volume: 124 mL. No fibroids or other mass visualized. Endometrium Thickness: 0.7 cm.  No focal abnormality. Right ovary Measurements: 6.8 x 3.1 x 5.3 cm = volume: 59 mL. The right ovary is enlarged by a 3.9 cm complex cystic mass with reticular pattern of internal echoes. No internal flow or solid elements. Left ovary Measurements: 3.6 x 2.3 x 3.7 cm = volume: 16 mL. Normal appearance/no  adnexal mass. Pulsed Doppler evaluation demonstrates normal low-resistance arterial and venous waveforms in both ovaries. Other: Trace simple free fluid, likely physiologic IMPRESSION: 1. A 3.9 cm hemorrhagic cyst is noted in the right ovary. No dedicated follow-up imaging is indicated. 2. Normal appearance of the uterus and left ovary. This recommendation follows the consensus statement: Management of Asymptomatic Ovarian and Other Adnexal Cysts Imaged at [place]: Society of Radiologists in Ultrasound Consensus Conference Statement. Radiology 2010; [phone]. Electronically Signed   By: Sherron Ales MD   On: 06/16/2021 15:08    ____________________________________________   PROCEDURES  Procedure(s) performed (including Critical Care):  .1-3 Lead EKG Interpretation  Date/Time: 06/16/2021 3:33 PM Performed by: Merwyn Katos, MD Authorized by: Merwyn Katos, MD     Interpretation: normal     ECG rate:  55   ECG rate assessment: normal     Rhythm: sinus rhythm     Ectopy: none     Conduction: normal     ____________________________________________   INITIAL IMPRESSION / ASSESSMENT AND PLAN / ED COURSE  As part of my medical decision  making, I reviewed the following data within the electronic medical record, if available:  Nursing notes reviewed and incorporated, Labs reviewed, EKG interpreted, Old chart reviewed, Radiograph reviewed and Notes from prior ED visits reviewed and incorporated     Patient is a 25 year old female who presents for post colloidal suprapubic abdominal pain with associated nausea/vomiting   Workup: CBC, BMP, UA, bHCG, pelvic ultrasound  Based on History, Exam, and Workup I believe the patient's presentation not consistent with ectopic pregnancy, molar pregnancy, life-threatening coagulopathy, trauma, serious bacterial infection, central process or other emergency.  Patient Stable Appearing and presentation most likely secondary to ruptured ovarian cyst or  other non-emergent cause of suprapubic abdominal pain  Disposition: Will discharge home with return precautions and instruction for prompt OBGYN follow up.      ____________________________________________   FINAL CLINICAL IMPRESSION(S) / ED DIAGNOSES  Final diagnoses:  Lower abdominal pain  Non-intractable vomiting with nausea, unspecified vomiting type  Cyst of right ovary  Ruptured ovarian cyst     ED Discharge Orders          Ordered    HYDROcodone-acetaminophen (NORCO) 5-325 MG tablet  Every 4 hours PRN        06/16/21 1530             Note:  This document was prepared using Dragon voice recognition software and may include unintentional dictation errors.    Merwyn Katos, MD 06/16/21 [phone]

## 2021-06-16 NOTE — ED Triage Notes (Signed)
Pt to ED for lower abd pain/ "uterus pain" that started after having sex last week. Pain stopped last week, after having sex last night, has had severe pain since.  +nausea/vomiting Hx ovarian cysts

## 2021-06-18 ENCOUNTER — Other Ambulatory Visit: Payer: 59

## 2021-06-21 ENCOUNTER — Other Ambulatory Visit (HOSPITAL_COMMUNITY)
Admission: RE | Admit: 2021-06-21 | Discharge: 2021-06-21 | Disposition: A | Discharge status: Home / Self Care | Payer: 59 | Source: Ambulatory Visit | Attending: Obstetrics & Gynecology | Admitting: Obstetrics & Gynecology

## 2021-06-21 ENCOUNTER — Other Ambulatory Visit: Payer: Self-pay

## 2021-06-21 ENCOUNTER — Ambulatory Visit (INDEPENDENT_AMBULATORY_CARE_PROVIDER_SITE_OTHER): Payer: 59 | Admitting: Obstetrics & Gynecology

## 2021-06-21 ENCOUNTER — Encounter: Payer: Self-pay | Admitting: Obstetrics & Gynecology

## 2021-06-21 VITALS — BP 100/60 | Ht 65.0 in | Wt 143.0 lb

## 2021-06-21 DIAGNOSIS — N9419 Other specified dyspareunia: Secondary | ICD-10-CM | POA: Diagnosis not present

## 2021-06-21 DIAGNOSIS — R102 Pelvic and perineal pain: Secondary | ICD-10-CM

## 2021-06-21 DIAGNOSIS — Z124 Encounter for screening for malignant neoplasm of cervix: Secondary | ICD-10-CM | POA: Diagnosis present

## 2021-06-21 DIAGNOSIS — N926 Irregular menstruation, unspecified: Secondary | ICD-10-CM

## 2021-06-21 MED ORDER — NORETHIN ACE-ETH ESTRAD-FE 1-20 MG-MCG(24) PO TABS: 1.0000 | ORAL_TABLET | Freq: Every day | ORAL | 3 refills | Status: AC

## 2021-06-21 NOTE — Patient Instructions (Signed)
Norethindrone Acetate; Ethinyl Estradiol; Ferrous Fumarate Capsules or Tablets What is this medication? NORETHINDRONE ACETATE; ETHINYL ESTRADIOL; FERROUS FUMARATE (nor eth IN drone AS e tate; ETH in il es tra DYE ole; FER [place] FUE ma rate) prevents ovulation and pregnancy. It may also be used to treat acne. It belongs to a group of medications called oral contraceptives. It is a combination of the hormonesestrogen and progestin. This medicine may be used for other purposes; ask your health care provider orpharmacist if you have questions. COMMON BRAND NAME(S): [place]urovela [address] 1/20, [address], [place] [address] Fe, [place] Fe, [place], Gildess 24 Fe, Gildess Fe 1.5/30, Gildess Fe 1/20, Hailey 24 Fe, Hailey Fe 1.5/30, Junel Fe 1.5/30, Junel Fe 1/20, Junel Fe 24, [place] Fe, Lo Loestrin Fe, Loestrin 24 Fe, Loestrin FE 1.5/30, Loestrin FE 1/20, [place] 24 Fe, [place], Microgestin 24 Fe, Microgestin Fe 1.5/30, Microgestin Fe1/20, Tarina 24 Fe, Tarina Fe 1/20, Taysofy, Tayt[place], Tilia Fe, Tri-Legest Fe What should I tell my care team before I take this medication? They need to know if you have any of these conditions: Abnormal vaginal bleeding Blood vessel disease or blood clots Breast, cervical, endometrial, ovarian, liver, or uterine cancer Diabetes Gallbladder disease Having surgery Heart disease or recent heart attack High blood pressure High cholesterol or triglycerides History of irregular heartbeat or heart valve problems Kidney disease Liver disease Migraine headaches Protein C deficiency Protein S deficiency Recently had a baby, miscarriage, or abortion Stroke Systemic lupus erythematosus (SLE) Tobacco smoker An unusual or allergic reaction to estrogens, progestins, other medications, foods, dyes, or preservatives Pregnant or trying to get pregnant Breast-feeding How should I use this medication? Take this medication by mouth. To reduce nausea, this medication may be taken with  food. Follow the directions on the prescription label. Take this medication at the same time each day and in the order directed on the package.Do not take your medication more often than directed. A patient package insert for the product will be given with each prescription and refill. Read this sheet carefully each time. The sheet may changefrequently. Contact your care team regarding the use of this medication in children. Special care may be needed. This medication has been used in female childrenwho have started having menstrual periods. Overdosage: If you think you have taken too much of this medicine contact apoison control center or emergency room at once. NOTE: This medicine is only for you. Do not share this medicine with others. What if I miss a dose? If you miss a dose, refer to the patient information sheet you received with your medication for direction. If you miss more than one pill, this medicationmay not be as effective, and you may need to use another form of birth control. What may interact with this medication? Do not take this medication with the following: Dasabuvir; ombitasvir; paritaprevir; ritonavir Ombitasvir; paritaprevir; ritonavir This medication may also interact with the following: Acetaminophen Antibiotics or medications for infections, especially rifampin, rifabutin, rifapentine, and griseofulvin, and possibly penicillins or tetracyclines Aprepitant Ascorbic acid (vitamin C) Atorvastatin Barbiturate medications, such as phenobarbital Bosentan Carbamazepine Caffeine Clofibrate Cyclosporine Dantrolene Doxercalciferol Felbamate Grapefruit juice Hydrocortisone Medications for anxiety or sleeping problems, such as diazepam or temazepam Medications for diabetes, including pioglitazone Mineral oil Modafinil Mycophenolate Nefazodone Oxcarbazepine Phenytoin Prednisolone Ritonavir or other medications for HIV infection or AIDS Rosuvastatin Selegiline Soy  isoflavones supplements St. John's wort Tamoxifen or raloxifene Theophylline Thyroid hormones Topiramate Warfarin This list may not describe all possible interactions. Give your health  care provider a list of all the medicines, herbs, non-prescription drugs, or dietary supplements you use. Also tell them if you smoke, drink alcohol, or use illegaldrugs. Some items may interact with your medicine. What should I watch for while using this medication? Visit your care team for regular checks on your progress. You will need aregular breast and pelvic exam and Pap smear while on this medication. Use an additional method of contraception during the first cycle that you takethese tablets. If you have any reason to think you are pregnant, stop taking this medicationright away and contact your care team. If you are taking this medication for hormone related problems, it may takeseveral cycles of use to see improvement in your condition. Smoking increases the risk of getting a blood clot or having a stroke while you are taking birth control pills, especially if you are more than 24 years old.You are strongly advised not to smoke. This medication can make your body retain fluid, making your fingers, hands, or ankles swell. Your blood pressure can go up. Contact your care team if you feelyou are retaining fluid. This medication can make you more sensitive to the sun. Keep out of the sun. If you cannot avoid being in the sun, wear protective clothing and use sunscreen.Do not use sun lamps or tanning beds/booths. If you wear contact lenses and notice visual changes, or if the lenses begin tofeel uncomfortable, consult your eye care specialist. In some women, tenderness, swelling, or minor bleeding of the gums may occur. Notify your dentist if this happens. Brushing and flossing your teeth regularly may help limit this. See your dentist regularly and inform your dentist of themedications you are taking. If you are  going to have elective surgery, you may need to stop taking thismedication before the surgery. Consult your care team for advice. This medication does not protect you against HIV infection (AIDS) or any othersexually transmitted diseases. What side effects may I notice from receiving this medication? Side effects that you should report to your care team as soon as possible: Allergic reactions-skin rash, itching, hives, swelling of the face, lips, tongue, or throat Blood clot-pain, swelling, or warmth in the leg, shortness of breath, chest pain Gallbladder problems-severe stomach pain, nausea, vomiting, fever Increase in blood pressure Liver injury-right upper belly pain, loss of appetite, light-colored stool, dark yellow or brown urine, yellowing skin or eyes, unusual weakness or fatigue New or worsening migraines or headaches Stroke-sudden numbness or weakness of the face, arm or leg, trouble speaking, confusion, trouble walking, loss of balance or coordination, dizziness, severe headache, change in vision Unusual vaginal discharge, itching, or odor Worsening mood, feelings of depression Side effects that usually do not require medical attention (report these toyour care team if they continue or are bothersome): Breast pain or tenderness Dark patches of skin on the face or other sun-exposed areas Irregular menstrual cycles or spotting Nausea Weight gain This list may not describe all possible side effects. Call your doctor for medical advice about side effects. You may report side effects to FDA at1-800-FDA-1088. Where should I keep my medication? Keep out of the reach of children and pets. Store at room temperature between 15 and 30 degrees C (59 and 86 degrees F).Throw away any unused medication after the expiration date. NOTE: This sheet is a summary. It may not cover all possible information. If you have questions about this medicine, talk to your doctor, pharmacist, orhealth care  provider.  2022 Elsevier/Gold Standard (2020-09-14 12:27:45)  Ovarian Cyst  An ovarian cyst is a fluid-filled sac that forms on an ovary. The ovaries are small organs that produce eggs in women. Various types of cysts can form on the ovaries. Some may cause symptoms and require treatment. Most ovarian cysts go away on their own, are not cancerous (are benign), and do not cause problems. What are the causes? Ovarian cysts may be caused by: Ovarian hyperstimulation syndrome. This is a condition that can develop from taking fertility medicines. It causes multiple large ovarian cysts to form. Polycystic ovarian syndrome (PCOS). This is a common hormonal disorder that can cause ovarian cysts to form, and can cause problems with your period or fertility. The normal menstrual cycle. What increases the risk? The following factors may make you more likely to develop this condition: Being overweight or obese. Taking fertility medicines. Taking certain forms of hormonal birth control. Smoking. What are the signs or symptoms? Many ovarian cysts do not cause symptoms. If symptoms are present, they may include: Pelvic pain or pressure. Pain in the lower abdomen. Pain during sex. Abdominal swelling. Abnormal menstrual periods. Increasing pain with menstrual periods. How is this diagnosed? These cysts are commonly found during a routine pelvic exam. You may have tests to find out more about the cyst, such as: Ultrasound. CT scan. MRI. Blood tests. How is this treated? Many ovarian cysts go away on their own without treatment. Your health care provider may want to check your cyst regularly for 2-3 months to see if it changes. If you are in menopause, it is especially important to have your cystmonitored closely because menopausal women have a higher rate of ovarian cancer. When treatment is needed, it may include: Medicines to help relieve pain. A procedure to drain the cyst  (aspiration). Surgery to remove the whole cyst (cystectomy). Hormone treatment or birth control pills. These methods are sometimes used to help keep cysts from coming back. Surgery to remove the ovary (oophorectomy). Follow these instructions at home: Take over-the-counter and prescription medicines only as told by your health care provider. Ask your health care provider if any medicine prescribed to you requires you to avoid driving or using machinery. Get regular pelvic exams and Pap tests as often as told by your health care provider. Return to your normal activities as told by your health care provider. Ask your health care provider what activities are safe for you. Do not use any products that contain nicotine or tobacco, such as cigarettes, e-cigarettes, and chewing tobacco. If you need help quitting, ask your health care provider. Keep all follow-up visits. This is important. Contact a health care provider if: Your periods are late, irregular, painful, or they stop. You have pelvic pain that does not go away. You have pressure on your bladder or trouble emptying your bladder completely. You have any of the following: A feeling of fullness. You are gaining weight or losing weight without changing your exercise and eating habits. Pain, swelling, or bloating in the abdomen. Loss of appetite. Pain and pressure in your back and pelvis. You think you may be pregnant. Get help right away if: You have abdominal or pelvic pain that is severe or gets worse. You cannot eat or drink without vomiting. You suddenly develop a fever or chills. Your menstrual period is much heavier than usual. Summary An ovarian cyst is a fluid-filled sac that forms on an ovary. Some ovarian cysts may cause symptoms and require treatment. These cysts are commonly found during a routine pelvic exam.  Many ovarian cysts go away on their own without treatment. This information is not intended to replace advice  given to you by your health care provider. Make sure you discuss any questions you have with your healthcare provider. Document Revised: 04/02/2020 Document Reviewed: 04/02/2020 Elsevier Patient Education  2022 [employer].

## 2021-06-21 NOTE — Progress Notes (Signed)
HPI: Patient is a 25 y.o. G1P0010 who LMP was Patient's last menstrual period was 05/24/2021., presents today for a problem visit.  She complains of recent findings of Right ovarion cyst by Ultrasound - Pelvic Vaginal.  Pt has had symptoms of pain, and she reports she has had pain w menses and sex for >6 mos .  She reports irreg cycles at this time every 28-40 days, but this is improved from even larger spacing oput of periods in past.  She used to have menorrhagia, but periods now are light.  She uses condoms for contraception.  She reports pain w intercourse regularly.  Recently went to ER after more severe pain w sex, and was found to have cyst on right ovary 3.8 cm.    Previous evaluation: emergency room visit on 06/16/21. Prior Diagnosis: prior ovarian cyst rupture. Previous Treatment: none.  PMHx: She  has a past medical history of Ovarian cyst. Also,  has no past surgical history on file., family history is not on file.,  reports that she has been smoking e-cigarettes and cigarettes. She has never used smokeless tobacco. She reports current alcohol use. She reports current drug use. Drug: Marijuana.  She has a current medication list which includes the following prescription(s): gabapentin, norethindrone acetate-ethinyl estrad-fe, hydrocodone-acetaminophen, prednisone, and probiotic product. Also, has No Known Allergies.  Review of Systems  Constitutional:  Positive for malaise/fatigue. Negative for chills and fever.  HENT:  Negative for congestion, sinus pain and sore throat.   Eyes:  Negative for blurred vision and pain.  Respiratory:  Negative for cough and wheezing.   Cardiovascular:  Negative for chest pain and leg swelling.  Gastrointestinal:  Positive for abdominal pain and diarrhea. Negative for constipation, heartburn, nausea and vomiting.  Genitourinary:  Negative for dysuria, frequency, hematuria and urgency.  Musculoskeletal:  Negative for back pain, joint pain, myalgias and  neck pain.  Skin:  Negative for itching and rash.  Neurological:  Positive for headaches. Negative for dizziness, tremors and weakness.  Endo/Heme/Allergies:  Does not bruise/bleed easily.  Psychiatric/Behavioral:  Negative for depression. The patient is not nervous/anxious and does not have insomnia.    Objective: BP 100/60   Ht 5\' 5"  (1.651 m)   Wt 143 lb (64.9 kg)   LMP 05/24/2021   BMI 23.80 kg/m  Physical Exam Constitutional:      General: She is not in acute distress.    Appearance: She is well-developed.  Genitourinary:     Right Labia: No rash or tenderness.    Left Labia: No tenderness or rash.    No vaginal erythema or bleeding.      Right Adnexa: not tender and no mass present.    Left Adnexa: not tender and no mass present.    No cervical motion tenderness, discharge, polyp or nabothian cyst.     Uterus is not enlarged.     No uterine mass detected.    Pelvic exam was performed with patient in the lithotomy position.  HENT:     Head: Normocephalic and atraumatic.     Nose: Nose normal.  Abdominal:     General: There is no distension.     Palpations: Abdomen is soft.     Tenderness: There is no abdominal tenderness.  Musculoskeletal:        General: Normal range of motion.  Neurological:     Mental Status: She is alert and oriented to person, place, and time.     Cranial Nerves: No cranial nerve  deficit.  Skin:    General: Skin is warm and dry.  Psychiatric:        Attention and Perception: Attention normal.        Mood and Affect: Mood and affect normal.        Speech: Speech normal.        Behavior: Behavior normal.        Thought Content: Thought content normal.        Judgment: Judgment normal.    ASSESSMENT/PLAN:    Problem List Items Addressed This Visit     Pelvic pain    -  Primary   Dyspareunia due to medical condition in female       Irregular menses       Screening for malignant neoplasm of cervix       Relevant Orders   Cytology - PAP    Normal exam, no distension or pain or mass. Concern for recurrent ovarian cyst, endometriosis, dysmenorrhea. Plan hormonal regulation/ suppression, see how she respond.  Pros and cons discussed.  Sample and\ info gv.  OCPs The risks /benefits of OCPs have been explained to the patient in detail.  Product literature has been given to her.  I have instructed her in the use of OCPs and have given her literature reinforcing this information.  I have explained to the patient that OCPs are not as effective for birth control during the first month of use, and that another form of contraception should be used during this time.  Both first-day start and Sunday start have been explained.  The risks and benefits of each was discussed.  She has been made aware of  the fact that other medications may affect the efficacy of OCPs.  I have answered all of her questions, and I believe that she has an understanding of the effectiveness and use of OCPs.  Follow up 3 mos, sooner based on pain. Consider [place] based on sx's.  Annamarie Major, MD, [facility], [facility] 06/21/2021  8:32 AM

## 2021-06-22 ENCOUNTER — Telehealth: Payer: Self-pay | Admitting: Family Medicine

## 2021-06-22 NOTE — Telephone Encounter (Signed)
Patient was recently in ER for a ruptured cyst and is currently experiencing similar pain. Was prescribed BC pills and is wanting to get blood work done to see if safe to take.

## 2021-06-22 NOTE — Telephone Encounter (Signed)
Phone call to pt. Pt states she did not agree with what Dr. Tiburcio Pea at [facility] suggested and wants to see another provider for her problem. Pt states she has been having ongoing GYN problems for many years. Pt counseled that we do womens health physicals, paps, breast exams at [facility], if the provider were to find a concern or if someone has some types of medical issues the person would most likely be referred to a specialist. Pt states she will call her insurance company and call around to some providers about services for her particular issue. No appt desired at [facility] at this time.

## 2021-06-24 LAB — CYTOLOGY - PAP
Comment: NEGATIVE
Diagnosis: HIGH — AB
High risk HPV: POSITIVE — AB

## 2021-06-25 ENCOUNTER — Telehealth: Payer: Self-pay | Admitting: Obstetrics & Gynecology

## 2021-06-25 NOTE — Telephone Encounter (Signed)
I contacted the pt to set up the Colpo.  She wants to be referred out to [facility].  She felt rushed and not heard at her appt so she wants this referral.

## 2021-06-25 NOTE — Progress Notes (Signed)
Sch Colpo w PH

## 2021-06-25 NOTE — Telephone Encounter (Signed)
Let her know we will forward the records (once a release is signed) to the group or doctor that she chooses.  Have her let us know whom that will be.  (Not a need for referral).

## 2021-06-28 ENCOUNTER — Ambulatory Visit: Payer: 59

## 2021-06-28 ENCOUNTER — Telehealth: Payer: Self-pay

## 2021-06-28 ENCOUNTER — Other Ambulatory Visit: Payer: 59

## 2021-06-28 ENCOUNTER — Ambulatory Visit: Admission: RE | Admit: 2021-06-28 | Payer: 59 | Source: Ambulatory Visit

## 2021-06-28 NOTE — Telephone Encounter (Signed)
Pt calling for clarification of results.  [phone]  Clarification given; reiterated she needs to have a colposcopy done.

## 2021-07-06 ENCOUNTER — Other Ambulatory Visit: Payer: Self-pay | Admitting: Orthopedic Surgery

## 2021-07-06 ENCOUNTER — Inpatient Hospital Stay: Admission: RE | Admit: 2021-07-06 | Payer: 59 | Source: Ambulatory Visit

## 2021-07-06 ENCOUNTER — Ambulatory Visit: Admission: RE | Admit: 2021-07-06 | Payer: 59 | Source: Ambulatory Visit

## 2021-07-06 ENCOUNTER — Ambulatory Visit: Payer: 59

## 2021-07-06 DIAGNOSIS — M76892 Other specified enthesopathies of left lower limb, excluding foot: Secondary | ICD-10-CM

## 2021-07-20 ENCOUNTER — Telehealth: Payer: 59 | Admitting: Obstetrics & Gynecology

## 2022-03-24 ENCOUNTER — Ambulatory Visit: Payer: Medicaid Other | Admitting: Obstetrics

## 2022-03-25 ENCOUNTER — Emergency Department
Admission: EM | Admit: 2022-03-25 | Discharge: 2022-03-25 | Disposition: A | Discharge status: Home / Self Care | Payer: Medicaid Other | Attending: Emergency Medicine | Admitting: Emergency Medicine

## 2022-03-25 ENCOUNTER — Other Ambulatory Visit: Payer: Self-pay

## 2022-03-25 ENCOUNTER — Encounter: Payer: Self-pay | Admitting: Emergency Medicine

## 2022-03-25 ENCOUNTER — Emergency Department: Payer: Medicaid Other

## 2022-03-25 DIAGNOSIS — Z3A01 Less than 8 weeks gestation of pregnancy: Secondary | ICD-10-CM

## 2022-03-25 DIAGNOSIS — O26891 Other specified pregnancy related conditions, first trimester: Secondary | ICD-10-CM | POA: Insufficient documentation

## 2022-03-25 DIAGNOSIS — R1032 Left lower quadrant pain: Secondary | ICD-10-CM | POA: Diagnosis not present

## 2022-03-25 LAB — CBC
HCT: 43.5 % (ref 36.0–46.0)
Hemoglobin: 14.5 g/dL (ref 12.0–15.0)
MCH: 30.4 pg (ref 26.0–34.0)
MCHC: 33.3 g/dL (ref 30.0–36.0)
MCV: 91.2 fL (ref 80.0–100.0)
Platelets: 257 10*3/uL (ref 150–400)
RBC: 4.77 MIL/uL (ref 3.87–5.11)
RDW: 13.1 % (ref 11.5–15.5)
WBC: 6.9 10*3/uL (ref 4.0–10.5)
nRBC: 0 % (ref 0.0–0.2)

## 2022-03-25 LAB — WET PREP, GENITAL
Clue Cells Wet Prep HPF POC: NONE SEEN
Sperm: NONE SEEN
Trich, Wet Prep: NONE SEEN
WBC, Wet Prep HPF POC: <10 (ref <10)
Yeast Wet Prep HPF POC: NONE SEEN

## 2022-03-25 LAB — COMPREHENSIVE METABOLIC PANEL
ALT: 31 U/L (ref 0–44)
AST: 24 U/L (ref 15–41)
Albumin: 4.5 g/dL (ref 3.5–5.0)
Alkaline Phosphatase: 47 U/L (ref 38–126)
Anion gap: 9 (ref 5–15)
BUN: 8 mg/dL (ref 6–20)
CO2: 24 mmol/L (ref 22–32)
Calcium: 9.5 mg/dL (ref 8.9–10.3)
Chloride: 102 mmol/L (ref 98–111)
Creatinine, Ser: 0.66 mg/dL (ref 0.44–1.00)
GFR, Estimated: >60 mL/min (ref >60)
Glucose, Bld: 89 mg/dL (ref 70–99)
Potassium: 3.7 mmol/L (ref 3.5–5.1)
Sodium: 135 mmol/L (ref 135–145)
Total Bilirubin: 1.8 mg/dL — ABNORMAL HIGH (ref 0.3–1.2)
Total Protein: 7.5 g/dL (ref 6.5–8.1)

## 2022-03-25 LAB — URINALYSIS, ROUTINE W REFLEX MICROSCOPIC
Bilirubin Urine: NEGATIVE
Glucose, UA: NEGATIVE mg/dL
Hgb urine dipstick: NEGATIVE
Ketones, ur: 80 mg/dL — AB
Leukocytes,Ua: NEGATIVE
Nitrite: NEGATIVE
Protein, ur: NEGATIVE mg/dL
Specific Gravity, Urine: 1.018 (ref 1.005–1.030)
pH: 7 (ref 5.0–8.0)

## 2022-03-25 LAB — CHLAMYDIA/NGC RT PCR (ARMC ONLY)
Chlamydia Tr: NOT DETECTED
N gonorrhoeae: NOT DETECTED

## 2022-03-25 LAB — LIPASE, BLOOD: Lipase: 21 U/L (ref 11–51)

## 2022-03-25 LAB — HCG, QUANTITATIVE, PREGNANCY: hCG, Beta Chain, Quant, S: 11356 m[IU]/mL — ABNORMAL HIGH (ref <5)

## 2022-03-25 MED ORDER — SODIUM CHLORIDE 0.9 % IV BOLUS
1000.0000 mL | Freq: Once | INTRAVENOUS | Status: AC
  Administered 2022-03-25: 1000 mL via INTRAVENOUS

## 2022-03-25 NOTE — ED Notes (Signed)
Ultrasound states they are waiting on beta to result to take pt. Pt updated

## 2022-03-25 NOTE — ED Notes (Signed)
Pt provided labeled swabs and instructed on sample collection per EPD request. Pt verbalizes understanding.

## 2022-03-25 NOTE — ED Triage Notes (Signed)
Pt reports llq abd pain that started this weekend with a large amount of vomiting, pt went to the ER in [place] and was told that she was pregnant, reports lmp 02-20-22, pt states they ordered an u/s but no one was in the facility to perform it, pt reports that she cont to have llq pain, denies vaginal bleeding

## 2022-03-25 NOTE — ED Provider Notes (Signed)
[facility] Provider Note    Event Date/Time   First MD Initiated Contact with Patient 03/25/22 1142     (approximate)   History   Abdominal Pain   HPI  Meagan Gonzalez is a 26 y.o. female who comes in with left lower quadrant pain.  Patient reports that she went to outside ER and was told that she was pregnant and that they want to do an ultrasound to rule out ectopic pregnancy.  However she was supposed to pay the bill before she left and she did not have any money to pay it so she did not want to go back to the hospital.  She switched over her insurance to getting Medicare for pregnancy and she made an appointment with [facility] yesterday but they were going to give her to her getting an ultrasound therefore she presents today to the ER instead.  She reports that the pain in her left lower quadrant is much better but she wanted to make sure there was no evidence of ectopic pregnancy.  She denies any vaginal bleeding.   Review of blood work on 09/2020 but is O negative.  Patient does report having 1 history of miscarriage but she did not come to the hospital for it.  She understands that if she develops vaginal bleeding in the future that she would need to come in to get RhoGAM.   I reviewed the records where patient was seen on 5/14 at outside ER where patient came in with left lower quadrant pain and they were concerned about possible ectopic but they did not have ultrasound so patient was supposed to return in the morning for reevaluation.   Physical Exam   Triage Vital Signs: ED Triage Vitals [03/25/22 1125]  Enc Vitals Group     BP 124/84     Pulse Rate 82     Resp 16     Temp 98.2 F (36.8 C)     Temp Source Oral     SpO2 100 %     Weight 138 lb (62.6 kg)     Height 5\' 5"  (1.651 m)     Head Circumference      Peak Flow      Pain Score 4     Pain Loc      Pain Edu?      Excl. in [place]?     Most recent vital signs: Vitals:    03/25/22 1125  BP: 124/84  Pulse: 82  Resp: 16  Temp: 98.2 F (36.8 C)  SpO2: 100%     General: Awake, no distress.  CV:  Good peripheral perfusion.  Resp:  Normal effort.  Abd:  No distention.  Soft and nontender Other:     ED Results / Procedures / Treatments   Labs (all labs ordered are listed, but only abnormal results are displayed) Labs Reviewed  LIPASE, BLOOD  COMPREHENSIVE METABOLIC PANEL  CBC  URINALYSIS, ROUTINE W REFLEX MICROSCOPIC  HCG, QUANTITATIVE, PREGNANCY    RADIOLOGY I have reviewed the ultrasound personally and interpreted and there is gestational sac but no evidence of heartbeat       MEDICATIONS ORDERED IN ED: Medications - No data to display   IMPRESSION / MDM / Gap / ED COURSE  I reviewed the triage vital signs and the nursing notes.  This is concerning for acute life-threatening pathology with a differential including ectopic pregnancy but could also be normal pregnancy, ovarian torsion, UTI, STDs.  She denies any symptoms of STDs so she will self swab  Lipase normal CMP normal slightly elevated T. Bili CBC normal UA with some ketones but she does report recently being sick therefore patient got some IV fluid  Ultrasound with gestational sac without evidence of heartbeat hCG was 1205 days ago and uptrending to 11,000 today.  Discussed with patient given O- blood type need to come back for RhoGAM if she develops any vaginal bleeding.  She is requesting RhoGAM to be given today to help prevent issues with this pregnancy.  I did confirm with Lloyd Huger from [facility] that patient does not need RhoGAM at this time given no bleeding however does agree that if she develops vaginal bleeding she can return to the ER but RhoGAM at this time would do nothing for this pregnancy and that they will help schedule her follow-up for repeat hCG, ultrasound given no cardiac activity  We discussed using a prenatal, avoiding  ibuprofen alcohol, drugs and following up with OB/GYN    FINAL CLINICAL IMPRESSION(S) / ED DIAGNOSES   Final diagnoses:  Less than [redacted] weeks gestation of pregnancy     Rx / DC Orders   ED Discharge Orders     None        Note:  This document was prepared using Dragon voice recognition software and may include unintentional dictation errors.   Vanessa [place], MD 03/25/22 [phone]

## 2022-03-25 NOTE — ED Notes (Signed)
See triage note. This RN to bedside; NT collected blood-work; pt up to restroom; pt calm, steady, resp reg/unlabored, and skin dry. Pt denies changes to urination. Pt reports recent issues with nausea and not being able to keep enough food down; reports intermittent issues with diarrhea last 3 days; pt reports site of pain is non-tender.

## 2022-03-25 NOTE — ED Notes (Signed)
Pt given blanket, remote, pillow, bed adjusted per request. Pt updated on plan of care.

## 2022-03-25 NOTE — Discharge Instructions (Addendum)
At this time RhoGAM is not indicated but if you develop bleeding need to return for RhoGAM.  I discussed the case with the OB/GYN team who also agrees do not need RhoGAM at this time but they will test you for the antibodies of it when you follow-up with them.  Please avoid alcohol, drugs, ibuprofen.  Take a prenatal vitamin and follow-up for repeat hCG, ultrasound given no cardiac activity at this time    IMPRESSION: Single intrauterine gestational sac with estimated gestational age of [redacted] weeks 3 days by mean sac diameter. Consider correlation with serial b-hCG levels, and followup ultrasound to assess viability in 10-14 days.

## 2022-03-29 ENCOUNTER — Ambulatory Visit (INDEPENDENT_AMBULATORY_CARE_PROVIDER_SITE_OTHER): Payer: Self-pay | Admitting: Family Medicine

## 2022-03-29 ENCOUNTER — Encounter: Payer: Self-pay | Admitting: Family Medicine

## 2022-03-29 VITALS — BP 122/70 | Ht 65.0 in | Wt 141.0 lb

## 2022-03-29 DIAGNOSIS — Z3490 Encounter for supervision of normal pregnancy, unspecified, unspecified trimester: Secondary | ICD-10-CM

## 2022-03-29 DIAGNOSIS — R1032 Left lower quadrant pain: Secondary | ICD-10-CM

## 2022-03-29 DIAGNOSIS — O26891 Other specified pregnancy related conditions, first trimester: Secondary | ICD-10-CM

## 2022-03-29 DIAGNOSIS — Z9889 Other specified postprocedural states: Secondary | ICD-10-CM

## 2022-03-29 DIAGNOSIS — N926 Irregular menstruation, unspecified: Secondary | ICD-10-CM

## 2022-03-29 DIAGNOSIS — Z3201 Encounter for pregnancy test, result positive: Secondary | ICD-10-CM

## 2022-03-29 DIAGNOSIS — N912 Amenorrhea, unspecified: Secondary | ICD-10-CM

## 2022-03-29 DIAGNOSIS — R8271 Bacteriuria: Secondary | ICD-10-CM

## 2022-03-29 LAB — POCT URINE PREGNANCY: Preg Test, Ur: POSITIVE — AB

## 2022-03-29 NOTE — Progress Notes (Signed)
   Amenorrhea with positive UPT PROBLEM  VISIT ENCOUNTER NOTE  Subjective:   Meagan Gonzalez is a 26 y.o. G79P0010  female here for positive pregnancy test.  She has not had serial bhcg. Her last [place] was 5/18-- showed Gestational Sac but no embryo/cardiac activity at  [redacted]w[redacted]d-- BHCG was > 11K  Denies abnormal vaginal bleeding, discharge, pelvic pain, problems with intercourse or other gynecologic concerns.    Gynecologic History Patient's last menstrual period was 02/20/2022 (exact date).  Health Maintenance Due  Topic Date Due   COVID-19 Vaccine (1) Never done   HPV VACCINES (1 - 2-dose series) Never done   Hepatitis C Screening  Never done   TETANUS/TDAP  07/31/2018   The following portions of the patient's history were reviewed and updated as appropriate: allergies, current medications, past family history, past medical history, past social history, past surgical history and problem list.  Review of Systems Pertinent items are noted in HPI.   Objective:  BP 122/70   Ht 5\' 5"  (1.651 m)   Wt 141 lb (64 kg)   LMP 02/20/2022 (Exact Date)   BMI 23.46 kg/m  Gen: well appearing, NAD HEENT: no scleral icterus CV: RR Lung: Normal WOB Ext: warm well perfused   Assessment and Plan:   1. Early stage of pregnancy -Discussed concern for abnormal pregnancy - Culture, OB Urine - 02/22/2022 OB Transvaginal; Future - Beta hCG quant (ref lab)  2. Amenorrhea - US OB Transvaginal; Future - Beta hCG quant (ref lab)  3. Left lower quadrant abdominal pain - Culture, OB Urine  Please refer to After Visit Summary for other counseling recommendations.   Return in about 2 weeks (around 04/12/2022).  06/12/2022, MD, MPH, [facility] Attending Physician Faculty Practice- [facility]

## 2022-03-30 LAB — BETA HCG QUANT (REF LAB): hCG Quant: 29513 m[IU]/mL

## 2022-04-03 LAB — URINE CULTURE, OB REFLEX

## 2022-04-03 LAB — CULTURE, OB URINE

## 2022-04-06 ENCOUNTER — Telehealth: Payer: Self-pay

## 2022-04-06 DIAGNOSIS — R8271 Bacteriuria: Secondary | ICD-10-CM | POA: Insufficient documentation

## 2022-04-06 MED ORDER — AMOXICILLIN-POT CLAVULANATE 875-125 MG PO TABS: 1.0000 | ORAL_TABLET | Freq: Two times a day (BID) | ORAL | 0 refills | Status: AC

## 2022-04-06 NOTE — Addendum Note (Signed)
Addended by: Caryl Bis on: 04/06/2022 11:55 AM   Modules accepted: Orders

## 2022-04-06 NOTE — Telephone Encounter (Signed)
Pt calling reporting uti symptoms and seen her positive results and needs a rx sent in.

## 2022-04-06 NOTE — Progress Notes (Signed)
Pt aware.

## 2022-04-07 ENCOUNTER — Other Ambulatory Visit: Payer: Self-pay | Admitting: Family Medicine

## 2022-04-07 ENCOUNTER — Ambulatory Visit
Admission: RE | Admit: 2022-04-07 | Discharge: 2022-04-07 | Disposition: A | Discharge status: Home / Self Care | Payer: Medicaid Other | Source: Ambulatory Visit | Attending: Family Medicine | Admitting: Family Medicine

## 2022-04-07 DIAGNOSIS — Z3490 Encounter for supervision of normal pregnancy, unspecified, unspecified trimester: Secondary | ICD-10-CM

## 2022-04-07 DIAGNOSIS — Z3A01 Less than 8 weeks gestation of pregnancy: Secondary | ICD-10-CM | POA: Insufficient documentation

## 2022-04-07 DIAGNOSIS — N912 Amenorrhea, unspecified: Secondary | ICD-10-CM

## 2022-04-07 DIAGNOSIS — Z3687 Encounter for antenatal screening for uncertain dates: Secondary | ICD-10-CM | POA: Diagnosis not present

## 2022-04-13 ENCOUNTER — Ambulatory Visit: Payer: Medicaid Other | Admitting: Obstetrics

## 2023-02-19 IMAGING — CR DG HIP (WITH OR WITHOUT PELVIS) 2-3V*R*
3 series · 3 of 3 positions shown · non-contrast
Comparison: None.

CLINICAL DATA: Bilateral hip pain

EXAM:
DG PELVIS WITH BILATERAL HIP IMAGES: 4+V

[hip ap]
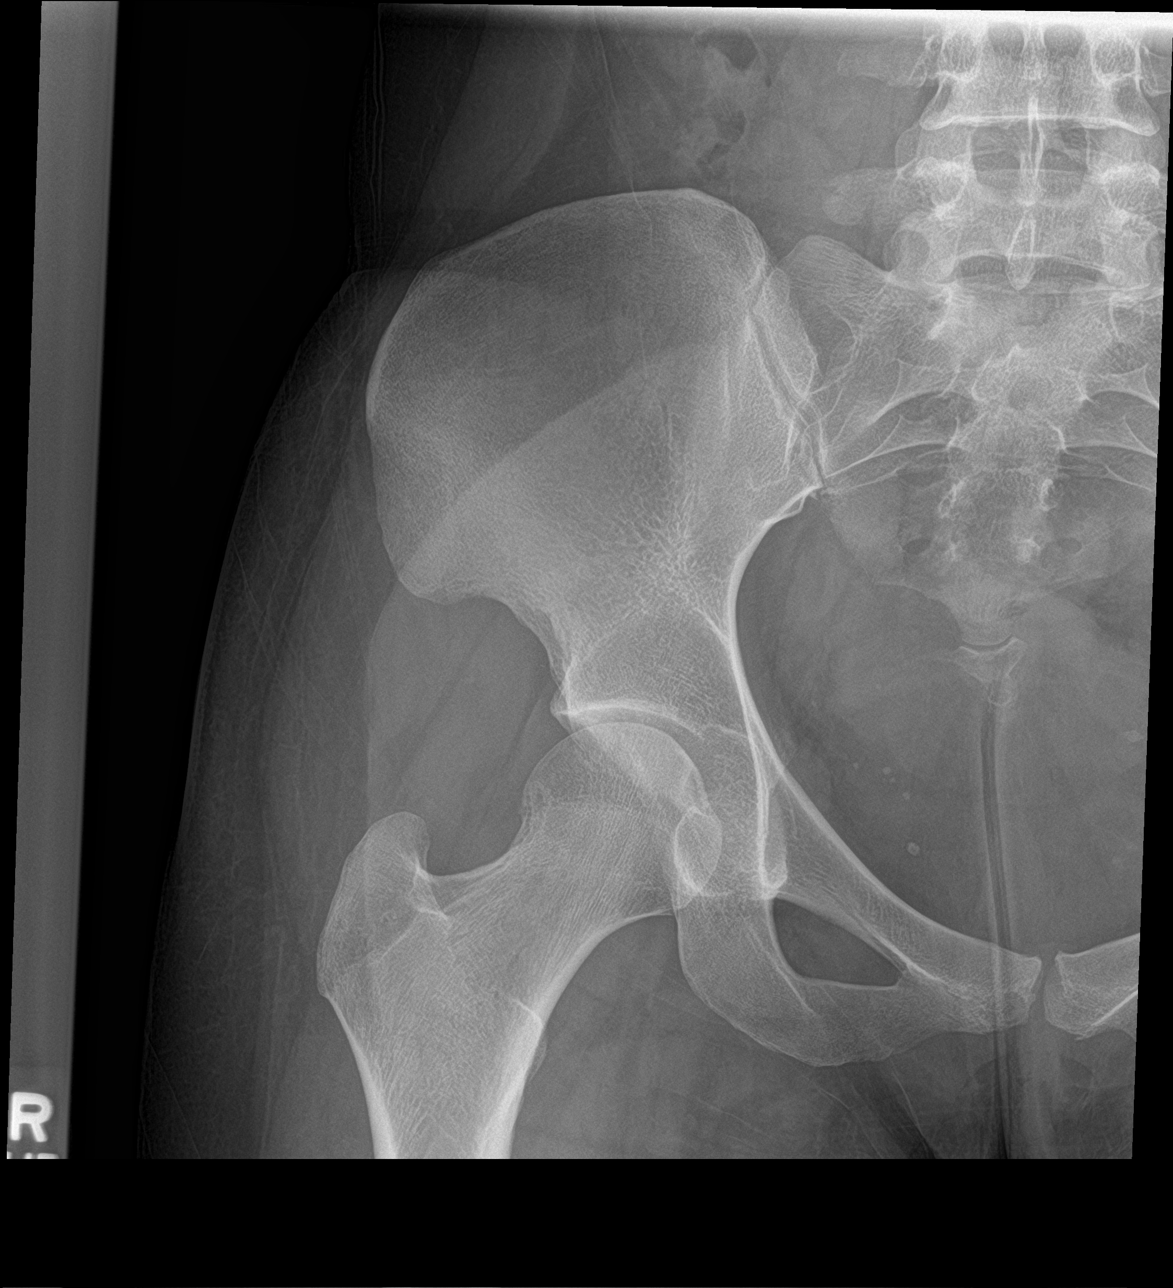

[pelvis ap]
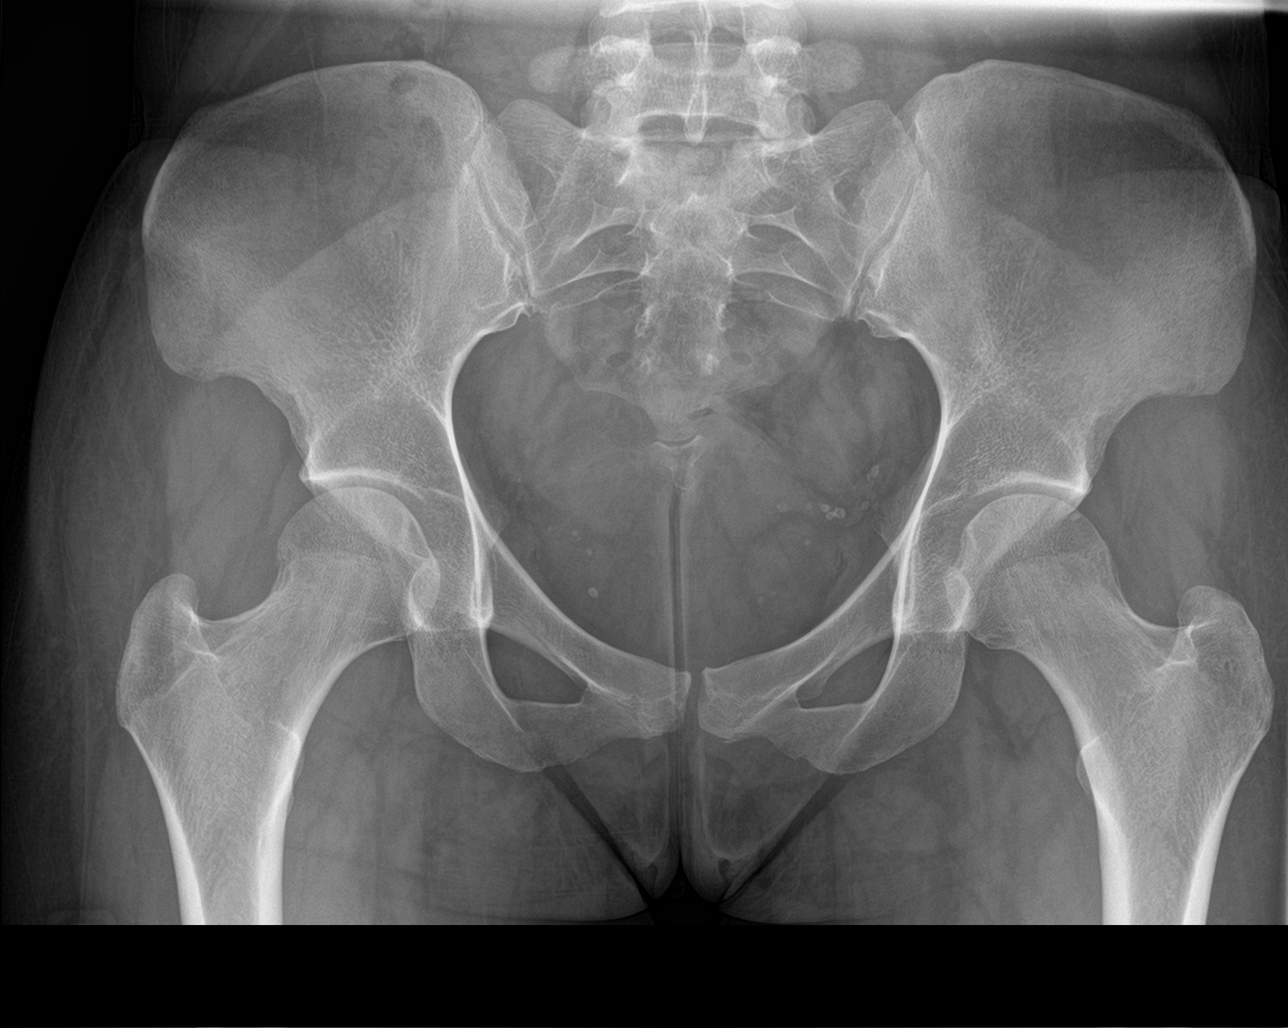

[hip lat]
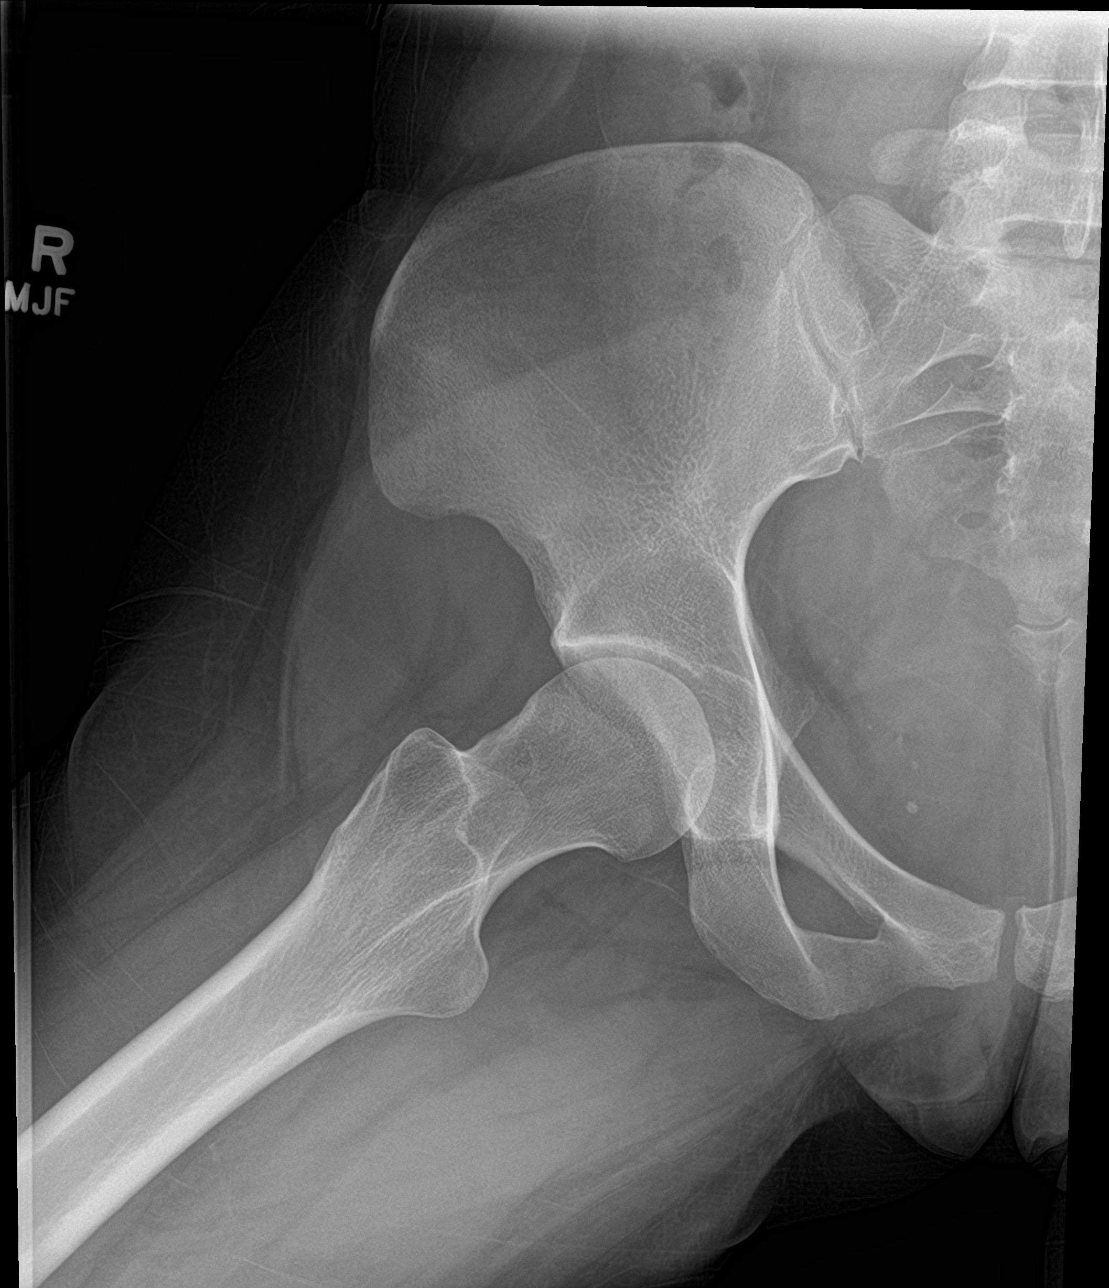

[3 of 3 positions shown; findings below may reference images not displayed]

FINDINGS: Frontal pelvis as well as frontal and lateral views of each
hip-total 5-views obtained. There is no fracture or dislocation.
Joint spaces appear normal. No erosive change. Probable phleboliths
in the pelvis.
IMPRESSION: No fracture or dislocation.  No appreciable arthropathy.

## 2023-02-19 IMAGING — MR MR KNEE*L* W/O CM
6 series · 40 of 40 positions shown · non-contrast
Comparison: None.

CLINICAL DATA: Chronic knee pain with multiple injuries over the
past 5 years.

EXAM:
MRI OF THE LEFT KNEE WITHOUT CONTRAST
TECHNIQUE: Multiplanar, multisequence MR imaging of the knee was performed. No
intravenous contrast was administered.

[Series 8: T2 fat-sat · axial · left · 4.0mm · 0.50mm/px · z∈[-80,+45]mm · 4 of 26 slices shown (1 of 3)]
[im 1/26]
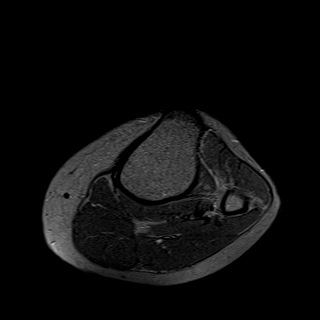
[im 9/26]
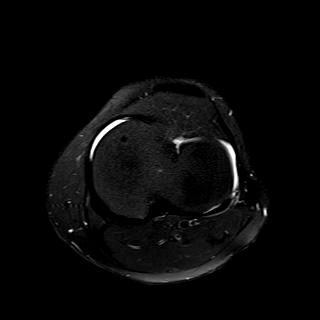
[im 17/26]
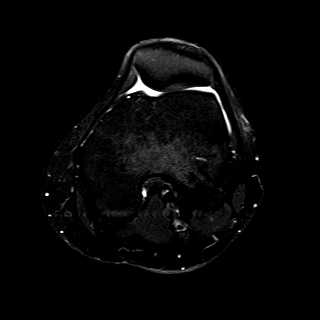
[im 26/26]
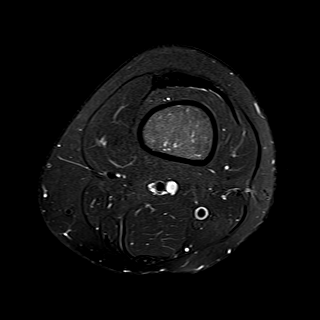

[Series 9: T1 · coronal · left · 4.0mm · 0.47mm/px · 7 of 32 slices shown]
[im 1/32]
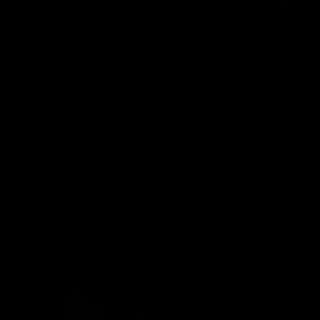
[im 6/32]
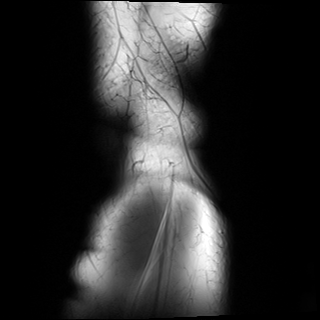
[im 11/32]
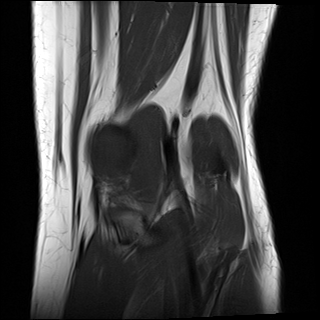
[im 16/32]
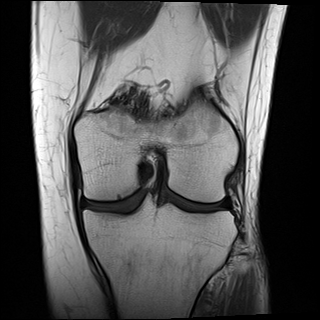
[im 21/32]
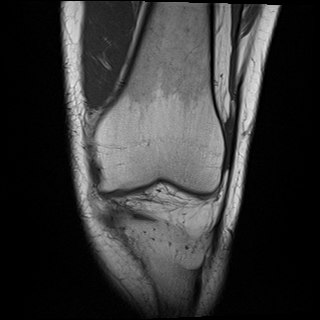
[im 26/32]
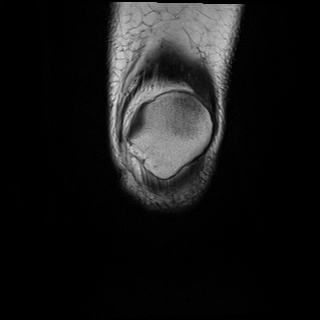
[im 32/32]
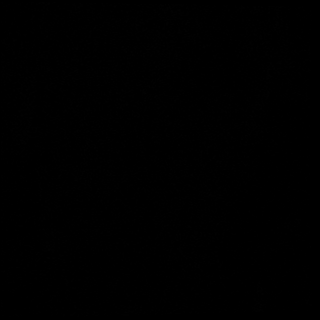

[Series 10: T2 fat-sat · coronal · left · 4.0mm · 0.47mm/px · 7 of 32 slices shown (2 of 3)]
[im 1/32]
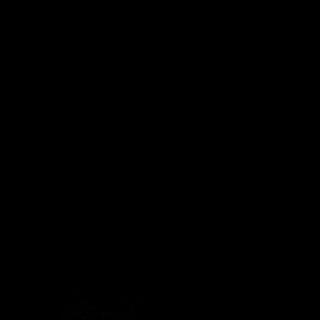
[im 6/32]
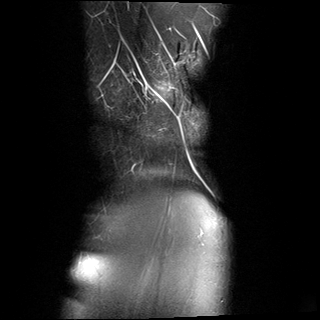
[im 11/32]
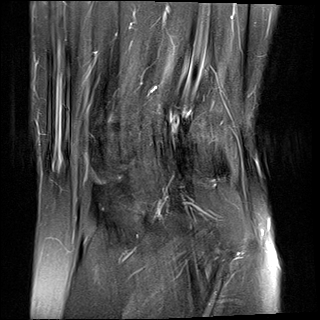
[im 16/32]
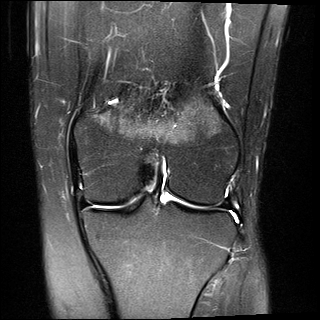
[im 21/32]
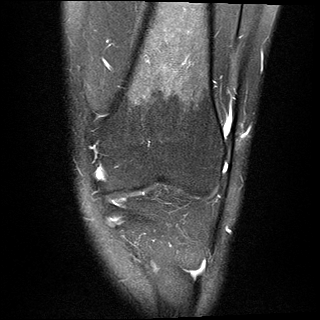
[im 26/32]
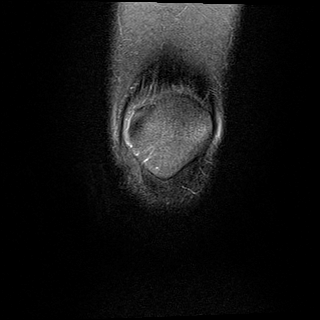
[im 32/32]
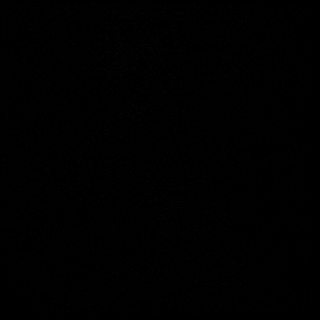

[Series 11: PD fat-sat · coronal · left · 4.0mm · 0.59mm/px · 7 of 32 slices shown (1 of 2)]
[im 1/32]
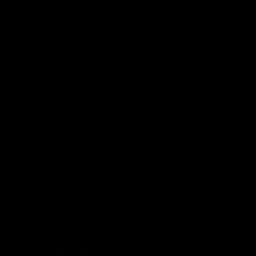
[im 6/32]
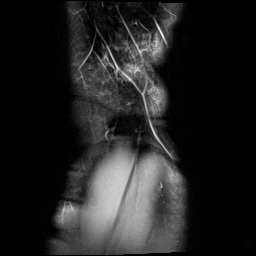
[im 11/32]
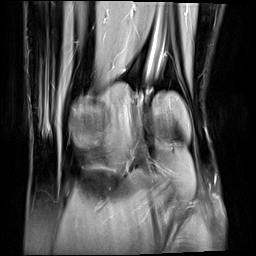
[im 16/32]
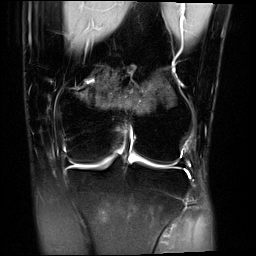
[im 21/32]
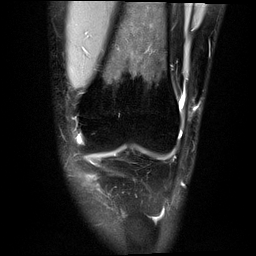
[im 26/32]
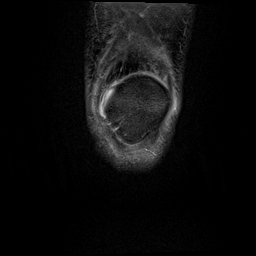
[im 32/32]
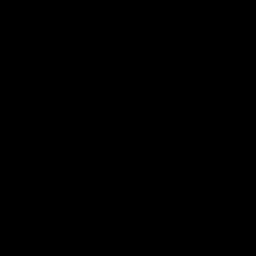

[Series 12: PD fat-sat · sagittal · left · 3.0mm · 0.47mm/px · 7 of 34 slices shown (2 of 2)]
[im 1/34]
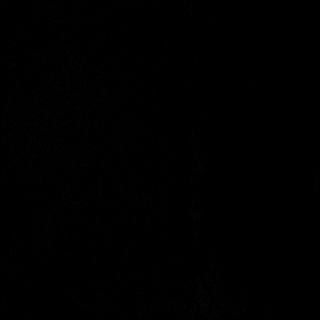
[im 6/34]
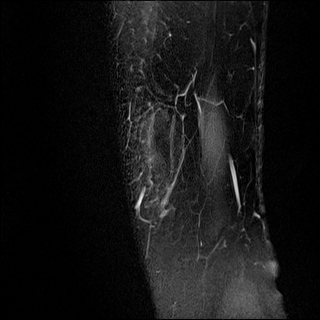
[im 12/34]
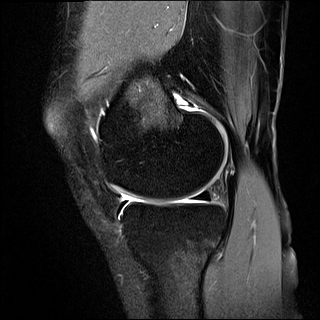
[im 17/34]
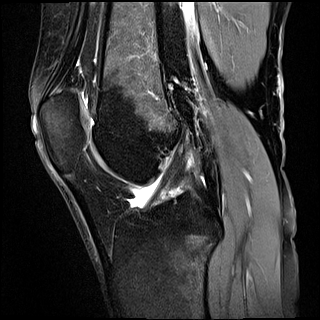
[im 23/34]
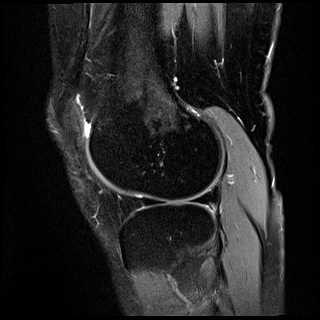
[im 28/34]
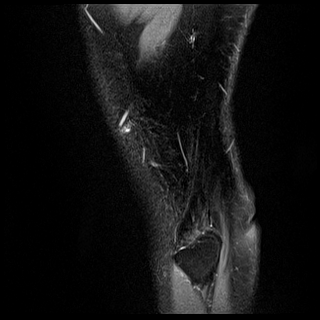
[im 34/34]
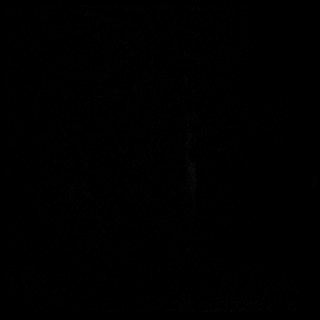

[Series 13: T2 fat-sat · sagittal · left · 3.0mm · 0.47mm/px · 8 of 36 slices shown (3 of 3)]
[im 1/36]
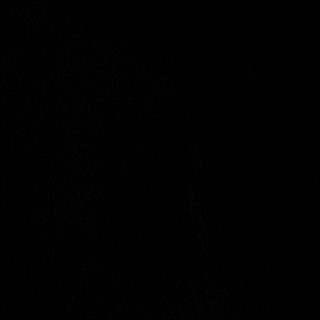
[im 6/36]
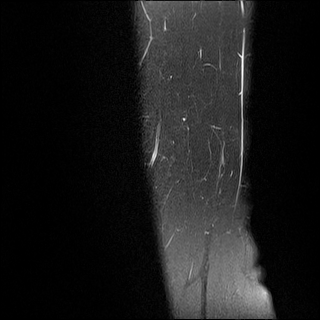
[im 11/36]
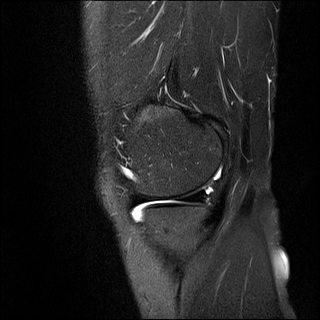
[im 16/36]
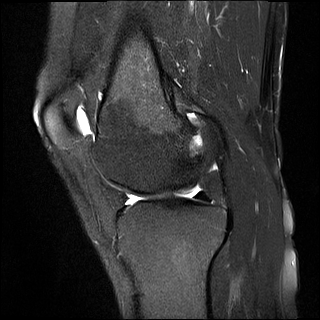
[im 21/36]
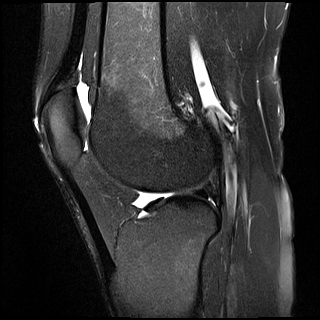
[im 26/36]
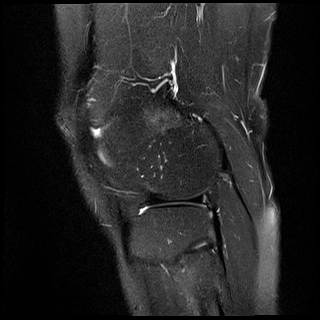
[im 31/36]
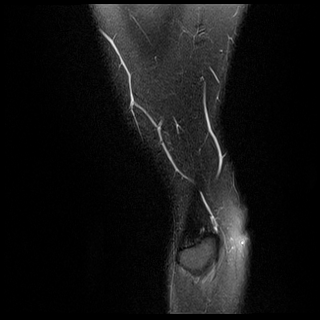
[im 36/36]
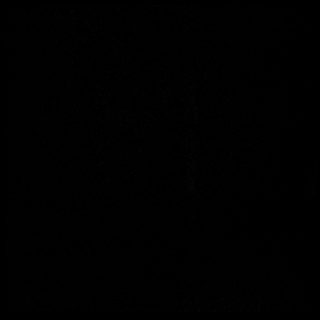

[40 of 40 positions shown; findings below may reference images not displayed]

FINDINGS: MENISCI

Medial meniscus: The medial meniscus is intact. I do not see a
meniscal tear. However, the anterior horn is undermined by fluid and
has a "free-floating" appearance. This can be seen meniscotibial
coronary ligament disruption.

Lateral meniscus:  Intact

LIGAMENTS

Cruciates:  Intact

Collaterals:  Intact

CARTILAGE

Patellofemoral:  Normal

Medial:  Normal

Lateral:  Normal

Joint:  Small joint effusion.

Popliteal Fossa:  No popliteal mass or Baker's cyst.

Extensor Mechanism: The patella retinacular structures are intact
and the quadriceps and patellar tendons are intact.

Bones: No acute bony findings. No bone contusion, marrow edema or
osteochondral lesion.

Other: Unremarkable knee musculature.
IMPRESSION: 1. The anterior horn of the medial meniscus is undermined by fluid
and has a "free-floating" appearance. This can be seen meniscotibial
coronary ligament disruption. No meniscal tears are identified.
2. Intact ligamentous structures and no acute bony findings.
3. Small joint effusion.
